# Patient Record
Sex: Female | Born: 1965
Health system: Southern US, Community
[De-identification: ages and names within clinical notes are randomized; demographics above are authoritative.]

## PROBLEM LIST (undated history)

## (undated) DIAGNOSIS — E034 Atrophy of thyroid (acquired): Secondary | ICD-10-CM

## (undated) DIAGNOSIS — E782 Mixed hyperlipidemia: Secondary | ICD-10-CM

## (undated) HISTORY — DX: Mixed hyperlipidemia: E78.2

---
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
## 2018-01-31 LAB — HM PAP SMEAR

## 2018-02-01 DIAGNOSIS — E038 Other specified hypothyroidism: Secondary | ICD-10-CM | POA: Diagnosis not present

## 2018-02-01 DIAGNOSIS — M8580 Other specified disorders of bone density and structure, unspecified site: Secondary | ICD-10-CM | POA: Diagnosis not present

## 2018-02-01 DIAGNOSIS — E782 Mixed hyperlipidemia: Secondary | ICD-10-CM | POA: Diagnosis not present

## 2018-02-13 DIAGNOSIS — E041 Nontoxic single thyroid nodule: Secondary | ICD-10-CM | POA: Diagnosis not present

## 2018-05-15 DIAGNOSIS — E034 Atrophy of thyroid (acquired): Secondary | ICD-10-CM | POA: Diagnosis not present

## 2018-08-15 DIAGNOSIS — E034 Atrophy of thyroid (acquired): Secondary | ICD-10-CM | POA: Diagnosis not present

## 2018-08-15 DIAGNOSIS — Z1211 Encounter for screening for malignant neoplasm of colon: Secondary | ICD-10-CM | POA: Diagnosis not present

## 2018-08-15 DIAGNOSIS — Z1231 Encounter for screening mammogram for malignant neoplasm of breast: Secondary | ICD-10-CM | POA: Diagnosis not present

## 2018-09-04 DIAGNOSIS — Z1211 Encounter for screening for malignant neoplasm of colon: Secondary | ICD-10-CM | POA: Diagnosis not present

## 2018-09-04 LAB — COLOGUARD: Cologuard: NEGATIVE

## 2018-09-19 DIAGNOSIS — D72818 Other decreased white blood cell count: Secondary | ICD-10-CM | POA: Diagnosis not present

## 2018-10-05 DIAGNOSIS — Z1231 Encounter for screening mammogram for malignant neoplasm of breast: Secondary | ICD-10-CM | POA: Diagnosis not present

## 2018-10-05 LAB — HM MAMMOGRAPHY: HM Mammogram: NORMAL (ref 0–4)

## 2018-11-27 DIAGNOSIS — E782 Mixed hyperlipidemia: Secondary | ICD-10-CM | POA: Diagnosis not present

## 2018-11-27 DIAGNOSIS — E034 Atrophy of thyroid (acquired): Secondary | ICD-10-CM | POA: Diagnosis not present

## 2019-01-03 DIAGNOSIS — D72818 Other decreased white blood cell count: Secondary | ICD-10-CM | POA: Diagnosis not present

## 2019-12-16 ENCOUNTER — Other Ambulatory Visit: Payer: Self-pay

## 2019-12-16 MED ORDER — LEVOTHYROXINE SODIUM 88 MCG PO TABS: 88.0000 ug | ORAL_TABLET | Freq: Every day | ORAL | 0 refills | Status: DC

## 2019-12-18 ENCOUNTER — Other Ambulatory Visit: Payer: Self-pay | Admitting: Nurse Practitioner

## 2019-12-18 ENCOUNTER — Ambulatory Visit: Payer: Self-pay | Admitting: Nurse Practitioner

## 2019-12-18 ENCOUNTER — Other Ambulatory Visit: Payer: Self-pay

## 2019-12-18 ENCOUNTER — Ambulatory Visit (INDEPENDENT_AMBULATORY_CARE_PROVIDER_SITE_OTHER): Payer: 59 | Admitting: Nurse Practitioner

## 2019-12-18 ENCOUNTER — Encounter: Payer: Self-pay | Admitting: Nurse Practitioner

## 2019-12-18 VITALS — BP 110/78 | HR 60 | Temp 97.5°F | Ht 62.0 in | Wt 146.0 lb

## 2019-12-18 DIAGNOSIS — E034 Atrophy of thyroid (acquired): Secondary | ICD-10-CM | POA: Diagnosis not present

## 2019-12-18 DIAGNOSIS — E782 Mixed hyperlipidemia: Secondary | ICD-10-CM

## 2019-12-18 DIAGNOSIS — R799 Abnormal finding of blood chemistry, unspecified: Secondary | ICD-10-CM | POA: Diagnosis not present

## 2019-12-18 NOTE — Assessment & Plan Note (Signed)
Well controlled.  ?No changes to medicines.  ?Continue to work on eating a healthy diet and exercise.  ?Labs drawn today.  ?

## 2019-12-18 NOTE — Progress Notes (Signed)
Established Patient Office Visit  Subjective:  Patient ID: Joann Jenkins, female    DOB: 1966-08-24  Age: 54 y.o. MRN: [id number]  [id number]  is a  54 year old female. She is here for 6 months follow up on Hypothyroidism, and hyperlipidemia.  The patient's medications were reviewed and reconciled since the patient's last visit.  History details were provided by the patient. The history appears to be reliable.   Chief Complaint  Patient presents with  . Hypothyroidism    follow up  . Hyperlipidemia    follow up    HPI Joann Jenkins presents with atrophy of thyroid (acquired).  Date of first diagnosis 90.  She is currently taking Synthroid, 88 mcg daily.  TSH was last checked 6 months ago.  The result was reported as normal ( 1.910 mU/L ).  She denies related symptoms such as cold intolerance, constipation, depression, dry skin, fatigue, hair loss and weight gain.  She reports no symptoms suggestive of adverse medication effect.      Pt presents with hyperlipidemia.  Date of diagnosis 08/16/2018.  Current treatment includes a low cholesterol/low fat diet.  Compliance with treatment has been good; she takes her medication as directed, maintains her low cholesterol diet, follows up as directed, and maintains her exercise regimen.  She denies experiencing any hypercholesterolemia related symptoms.  Most recent lab tests include total cholesterol level ( fasting 244 mg/dl), triglycerides ( '182mg'$ /dl ), HDL ( 60 mg/dL ), and LDL cholesterol ( 148 mg/dl ).  States she stopped the Crestor due to joint pain.  Concurrent health problems include hypothyroidism.    Past Medical History:  Diagnosis Date  . Atrophy of thyroid (acquired)   . Mixed hyperlipidemia     Past Surgical History:  Procedure Laterality Date  . CESAREAN SECTION     X2 10-20-1994 & 07-30-1997    Family History  Problem Relation Age of Onset  . Pancreatic cancer Mother   . COPD Father     Social History    Socioeconomic History  . Marital status: Married    Spouse name: Not on file  . Number of children: 2  . Years of education: Not on file  . Highest education level: Not on file  Occupational History  . Not on file  Tobacco Use  . Smoking status: Never Smoker  . Smokeless tobacco: Never Used  Substance and Sexual Activity  . Alcohol use: Yes    Comment: Drinks approximately 2-3 times per week. She typically consumes wine.  . Drug use: Never  . Sexual activity: Not on file  Other Topics Concern  . Not on file  Social History Narrative  . Not on file   Social Determinants of Health   Financial Resource Strain:   . Difficulty of Paying Living Expenses:   Food Insecurity:   . Worried About Charity fundraiser in the Last Year:   . Arboriculturist in the Last Year:   Transportation Needs:   . Film/video editor (Medical):   Marland Kitchen Lack of Transportation (Non-Medical):   Physical Activity:   . Days of Exercise per Week:   . Minutes of Exercise per Session:   Stress:   . Feeling of Stress :   Social Connections:   . Frequency of Communication with Friends and Family:   . Frequency of Social Gatherings with Friends and Family:   . Attends Religious Services:   . Active Member of Clubs or Organizations:   .  Attends Archivist Meetings:   Marland Kitchen Marital Status:   Intimate Partner Violence:   . Fear of Current or Ex-Partner:   . Emotionally Abused:   Marland Kitchen Physically Abused:   . Sexually Abused:     Outpatient Medications Prior to Visit  Medication Sig Dispense Refill  . ezetimibe (ZETIA) 10 MG tablet Take 10 mg by mouth daily.    Marland Kitchen levothyroxine (SYNTHROID) 88 MCG tablet Take 1 tablet (88 mcg total) by mouth daily before breakfast. 30 tablet 0  . zolpidem (AMBIEN) 5 MG tablet Take 5 mg by mouth at bedtime as needed for sleep.     No facility-administered medications prior to visit.    Allergies  Allergen Reactions  . Lipitor [Atorvastatin]   . Rosuvastatin  Calcium     ROS Review of Systems  Constitutional: Negative for activity change and appetite change.  HENT: Negative for dental problem, ear discharge and sinus pain.   Eyes: Negative for discharge.  Respiratory: Positive for cough and chest tightness.   Cardiovascular: Negative for chest pain and palpitations.  Gastrointestinal: Negative for abdominal distention.  Genitourinary: Negative for difficulty urinating.  Musculoskeletal: Positive for arthralgias. Negative for myalgias.  Skin: Negative for color change.  Neurological: Negative for weakness.      Objective:    Physical Exam  Constitutional: She is oriented to person, place, and time. She appears well-developed and well-nourished.  HENT:  Head: Normocephalic.  Mouth/Throat: Oropharynx is clear and moist.  Eyes: Right eye exhibits no discharge. Left eye exhibits no discharge.  Cardiovascular: Normal rate and regular rhythm.  Pulmonary/Chest: Effort normal and breath sounds normal.  Abdominal: Soft. Bowel sounds are normal.  Musculoskeletal:     Cervical back: Normal range of motion and neck supple.  Neurological: She is alert and oriented to person, place, and time.  Skin: Skin is warm. No rash noted.    BP 110/78 (BP Location: Left Arm, Patient Position: Sitting)   Pulse 60   Temp (!) 97.5 F (36.4 C) (Temporal)   Ht '5\' 2"'$  (1.575 m)   Wt 146 lb (66.2 kg)   SpO2 99%   BMI 26.70 kg/m  Wt Readings from Last 3 Encounters:  12/18/19 146 lb (66.2 kg)     Health Maintenance Due  Topic Date Due  . Hepatitis C Screening  Never done  . HIV Screening  Never done  . TETANUS/TDAP  Never done  . COLONOSCOPY  Never done  . INFLUENZA VACCINE  Never done    There are no preventive care reminders to display for this patient.  No results found for: TSH No results found for: WBC, HGB, HCT, MCV, PLT No results found for: NA, K, CHLORIDE, CO2, GLUCOSE, BUN, CREATININE, BILITOT, ALKPHOS, AST, ALT, PROT, ALBUMIN,  CALCIUM, ANIONGAP, EGFR, GFR No results found for: CHOL No results found for: HDL No results found for: LDLCALC No results found for: TRIG No results found for: CHOLHDL No results found for: HGBA1C    Assessment & Plan:  Atrophy of thyroid Well controlled.  No changes to medicines.  Labs drawn today.   Abnormal blood chemistry Labs drawn.  Mixed hyperlipidemia Well controlled.  No changes to medicines.  Continue to work on eating a healthy diet and exercise.  Labs drawn today.    Follow-up: Return in about 6 months (around 06/19/2020).    Ivy Lynn, NP

## 2019-12-18 NOTE — Assessment & Plan Note (Addendum)
Well controlled.  No changes to medicines.    Labs drawn today.  

## 2019-12-18 NOTE — Patient Instructions (Addendum)
Atrophy of thyroid Well controlled.  No changes to medicines.  Continue to work on eating a healthy diet and exercise.  Labs drawn today.   Abnormal blood chemistry Well controlled.  No changes to medicines.  Continue to work on eating a healthy diet and exercise.  Labs drawn today.   Mixed hyperlipidemia Well controlled.  No changes to medicines.  Continue to work on eating a healthy diet and exercise.  Labs drawn today.     Thyroid-Stimulating Hormone Test Why am I having this test? You may have a thyroid-stimulating hormone (TSH) test if you have possible symptoms of abnormal thyroid hormone levels. This test can help your health care provider:  Diagnose a disorder of the thyroid gland or pituitary gland.  Manage your condition and treatment if you have an underactive thyroid (hypothyroidism) or an overactive thyroid (hyperthyroidism). Newborn babies may have this test done to screen for hypothyroidism that is present at birth (congenital). The thyroid is a gland in the lower front of the neck. It makes hormones that affect many body parts and systems, including the system that affects how quickly the body burns fuel for energy (metabolism). The pituitary gland is located just below the brain, behind the eyes and nasal passages. It helps maintain thyroid hormone levels and thyroid gland function. What is being tested? This test measures the amount of TSH in your blood. TSH may also be called thyrotropin. When the thyroid does not make enough hormones, the pituitary gland releases TSH into the bloodstream to stimulate the thyroid gland to make more hormones. What kind of sample is taken?     A blood sample is required for this test. It is usually collected by inserting a needle into a blood vessel. For newborns, a small amount of blood may be collected from the umbilical cord, or by using a small needle to prick the baby's heel (heel stick). Tell a health care provider  about:  All medicines you are taking, including vitamins, herbs, eye drops, creams, and over-the-counter medicines.  Any blood disorders you have.  Any surgeries you have had.  Any medical conditions you have.  Whether you are pregnant or may be pregnant. How are the results reported? Your test results will be reported as a value that indicates how much TSH is in your blood. Your health care provider will compare your results to normal ranges that were established after testing a large group of people (reference ranges). Reference ranges may vary among labs and hospitals. For this test, common reference ranges are:  Adult: 2-10 microunits/mL or 2-10 milliunits/L.  Newborn: ? Heel stick: 3-18 microunits/mL or 3-18 milliunits/L. ? Umbilical cord: 3-12 microunits/mL or 3-12 milliunits/L. What do the results mean? Results that are within the reference range are considered normal. This means that you have a normal amount of TSH in your blood. Results that are higher than the reference range mean that your TSH levels are too high. This may mean:  Your thyroid gland is not making enough thyroid hormones.  Your thyroid medicine dosage is too low.  You have a tumor on your pituitary gland. This is rare. Results that are lower than the reference range mean that your TSH levels are too low. This may be caused by hyperthyroidism or by a problem with the pituitary gland function. Talk with your health care provider about what your results mean. Questions to ask your health care provider Ask your health care provider, or the department that is doing the test:  When will my results be ready?  How will I get my results?  What are my treatment options?  What other tests do I need?  What are my next steps? Summary  You may have a thyroid-stimulating hormone (TSH) test if you have possible symptoms of abnormal thyroid hormone levels.  The thyroid is a gland in the lower front of the neck.  It makes hormones that affect many body parts and systems.  The pituitary gland is located just below the brain, behind the eyes and nasal passages. It helps maintain thyroid hormone levels and thyroid gland function.  This test measures the amount of TSH in your blood. TSH is made by the pituitary gland. It may also be called thyrotropin. This information is not intended to replace advice given to you by your health care provider. Make sure you discuss any questions you have with your health care provider. Document Revised: 12/18/2017 Document Reviewed: 05/23/2017 Elsevier Patient Education  2020 Elsevier Inc. High Cholesterol  High cholesterol is a condition in which the blood has high levels of a white, waxy, fat-like substance (cholesterol). The human body needs small amounts of cholesterol. The liver makes all the cholesterol that the body needs. Extra (excess) cholesterol comes from the food that we eat. Cholesterol is carried from the liver by the blood through the blood vessels. If you have high cholesterol, deposits (plaques) may build up on the walls of your blood vessels (arteries). Plaques make the arteries narrower and stiffer. Cholesterol plaques increase your risk for heart attack and stroke. Work with your health care provider to keep your cholesterol levels in a healthy range. What increases the risk? This condition is more likely to develop in people who:  Eat foods that are high in animal fat (saturated fat) or cholesterol.  Are overweight.  Are not getting enough exercise.  Have a family history of high cholesterol. What are the signs or symptoms? There are no symptoms of this condition. How is this diagnosed? This condition may be diagnosed from the results of a blood test.  If you are older than age 3, your health care provider may check your cholesterol every 4-6 years.  You may be checked more often if you already have high cholesterol or other risk factors for  heart disease. The blood test for cholesterol measures:  "Bad" cholesterol (LDL cholesterol). This is the main type of cholesterol that causes heart disease. The desired level for LDL is less than 100.  "Good" cholesterol (HDL cholesterol). This type helps to protect against heart disease by cleaning the arteries and carrying the LDL away. The desired level for HDL is 60 or higher.  Triglycerides. These are fats that the body can store or burn for energy. The desired number for triglycerides is lower than 150.  Total cholesterol. This is a measure of the total amount of cholesterol in your blood, including LDL cholesterol, HDL cholesterol, and triglycerides. A healthy number is less than 200. How is this treated? This condition is treated with diet changes, lifestyle changes, and medicines. Diet changes  This may include eating more whole grains, fruits, vegetables, nuts, and fish.  This may also include cutting back on red meat and foods that have a lot of added sugar. Lifestyle changes  Changes may include getting at least 40 minutes of aerobic exercise 3 times a week. Aerobic exercises include walking, biking, and swimming. Aerobic exercise along with a healthy diet can help you maintain a healthy weight.  Changes may  also include quitting smoking. Medicines  Medicines are usually given if diet and lifestyle changes have failed to reduce your cholesterol to healthy levels.  Your health care provider may prescribe a statin medicine. Statin medicines have been shown to reduce cholesterol, which can reduce the risk of heart disease. Follow these instructions at home: Eating and drinking If told by your health care provider:  Eat chicken (without skin), fish, veal, shellfish, ground [place] breast, and round or loin cuts of red meat.  Do not eat fried foods or fatty meats, such as hot dogs and salami.  Eat plenty of fruits, such as apples.  Eat plenty of vegetables, such as  broccoli, potatoes, and carrots.  Eat beans, peas, and lentils.  Eat grains such as barley, rice, couscous, and bulgur wheat.  Eat pasta without cream sauces.  Use skim or nonfat milk, and eat low-fat or nonfat yogurt and cheeses.  Do not eat or drink whole milk, cream, ice cream, egg yolks, or hard cheeses.  Do not eat stick margarine or tub margarines that contain trans fats (also called partially hydrogenated oils).  Do not eat saturated tropical oils, such as coconut oil and palm oil.  Do not eat cakes, cookies, crackers, or other baked goods that contain trans fats.  General instructions  Exercise as directed by your health care provider. Increase your activity level with activities such as gardening, walking, and taking the stairs.  Take over-the-counter and prescription medicines only as told by your health care provider.  Do not use any products that contain nicotine or tobacco, such as cigarettes and e-cigarettes. If you need help quitting, ask your health care provider.  Keep all follow-up visits as told by your health care provider. This is important. Contact a health care provider if:  You are struggling to maintain a healthy diet or weight.  You need help to start on an exercise program.  You need help to stop smoking. Get help right away if:  You have chest pain.  You have trouble breathing. This information is not intended to replace advice given to you by your health care provider. Make sure you discuss any questions you have with your health care provider. Document Revised: 09/22/2017 Document Reviewed: 03/19/2016 Elsevier Patient Education  [place].

## 2019-12-19 LAB — LIPID PANEL W/O CHOL/HDL RATIO
Cholesterol, Total: 216 mg/dL — ABNORMAL HIGH (ref 100–199)
HDL: 66 mg/dL (ref >39)
LDL Chol Calc (NIH): 122 mg/dL — ABNORMAL HIGH (ref 0–99)
Triglycerides: 161 mg/dL — ABNORMAL HIGH (ref 0–149)
VLDL Cholesterol Cal: 28 mg/dL (ref 5–40)

## 2019-12-19 LAB — CBC WITH DIFFERENTIAL/PLATELET
Basophils Absolute: 0 10*3/uL (ref 0.0–0.2)
Basos: 1 %
EOS (ABSOLUTE): 0.1 10*3/uL (ref 0.0–0.4)
Eos: 4 %
Hematocrit: 39.6 % (ref 34.0–46.6)
Hemoglobin: 13.2 g/dL (ref 11.1–15.9)
Immature Grans (Abs): 0 10*3/uL (ref 0.0–0.1)
Immature Granulocytes: 0 %
Lymphocytes Absolute: 0.8 10*3/uL (ref 0.7–3.1)
Lymphs: 25 %
MCH: 31.2 pg (ref 26.6–33.0)
MCHC: 33.3 g/dL (ref 31.5–35.7)
MCV: 94 fL (ref 79–97)
Monocytes Absolute: 0.2 10*3/uL (ref 0.1–0.9)
Monocytes: 7 %
Neutrophils Absolute: 2 10*3/uL (ref 1.4–7.0)
Neutrophils: 63 %
Platelets: 245 10*3/uL (ref 150–450)
RBC: 4.23 x10E6/uL (ref 3.77–5.28)
RDW: 12.3 % (ref 11.7–15.4)
WBC: 3.2 10*3/uL — ABNORMAL LOW (ref 3.4–10.8)

## 2019-12-19 LAB — COMPREHENSIVE METABOLIC PANEL
ALT: 15 IU/L (ref 0–32)
AST: 20 IU/L (ref 0–40)
Albumin/Globulin Ratio: 2.2 (ref 1.2–2.2)
Albumin: 4.6 g/dL (ref 3.8–4.9)
Alkaline Phosphatase: 76 IU/L (ref 39–117)
BUN/Creatinine Ratio: 11 (ref 9–23)
BUN: 11 mg/dL (ref 6–24)
Bilirubin Total: 0.3 mg/dL (ref 0.0–1.2)
CO2: 26 mmol/L (ref 20–29)
Calcium: 9.3 mg/dL (ref 8.7–10.2)
Chloride: 104 mmol/L (ref 96–106)
Creatinine, Ser: 1.03 mg/dL — ABNORMAL HIGH (ref 0.57–1.00)
GFR calc Af Amer: 70 mL/min/{1.73_m2} (ref >59)
GFR calc non Af Amer: 61 mL/min/{1.73_m2} (ref >59)
Globulin, Total: 2.1 g/dL (ref 1.5–4.5)
Glucose: 85 mg/dL (ref 65–99)
Potassium: 5 mmol/L (ref 3.5–5.2)
Sodium: 142 mmol/L (ref 134–144)
Total Protein: 6.7 g/dL (ref 6.0–8.5)

## 2019-12-19 LAB — CARDIOVASCULAR RISK ASSESSMENT

## 2019-12-19 LAB — TSH: TSH: 2.59 u[IU]/mL (ref 0.450–4.500)

## 2019-12-25 ENCOUNTER — Other Ambulatory Visit: Payer: Self-pay | Admitting: Nurse Practitioner

## 2019-12-25 DIAGNOSIS — E782 Mixed hyperlipidemia: Secondary | ICD-10-CM

## 2019-12-25 MED ORDER — ICOSAPENT ETHYL 1 G PO CAPS: 2.0000 g | ORAL_CAPSULE | Freq: Two times a day (BID) | ORAL | 0 refills | Status: DC

## 2020-01-06 ENCOUNTER — Other Ambulatory Visit: Payer: Self-pay

## 2020-01-06 MED ORDER — LEVOTHYROXINE SODIUM 88 MCG PO TABS: 88.0000 ug | ORAL_TABLET | Freq: Every day | ORAL | 0 refills | Status: DC

## 2020-04-07 ENCOUNTER — Other Ambulatory Visit: Payer: Self-pay

## 2020-04-07 MED ORDER — EZETIMIBE 10 MG PO TABS: 10.0000 mg | ORAL_TABLET | Freq: Every day | ORAL | 2 refills | Status: DC

## 2020-04-09 ENCOUNTER — Other Ambulatory Visit: Payer: Self-pay

## 2020-04-09 MED ORDER — EZETIMIBE 10 MG PO TABS: 10.0000 mg | ORAL_TABLET | Freq: Every day | ORAL | 2 refills | Status: DC

## 2020-04-14 ENCOUNTER — Other Ambulatory Visit: Payer: Self-pay

## 2020-04-14 MED ORDER — LEVOTHYROXINE SODIUM 88 MCG PO TABS: 88.0000 ug | ORAL_TABLET | Freq: Every day | ORAL | 2 refills | Status: DC

## 2020-04-16 ENCOUNTER — Other Ambulatory Visit: Payer: Self-pay

## 2020-04-17 ENCOUNTER — Other Ambulatory Visit: Payer: Self-pay

## 2020-04-17 MED ORDER — LEVOTHYROXINE SODIUM 88 MCG PO TABS: 88.0000 ug | ORAL_TABLET | Freq: Every day | ORAL | 2 refills | Status: DC

## 2020-04-21 ENCOUNTER — Other Ambulatory Visit: Payer: Self-pay

## 2020-06-19 ENCOUNTER — Ambulatory Visit: Payer: 59 | Admitting: Nurse Practitioner

## 2020-06-23 ENCOUNTER — Encounter: Payer: Self-pay | Admitting: Legal Medicine

## 2020-06-23 ENCOUNTER — Other Ambulatory Visit: Payer: Self-pay

## 2020-06-23 ENCOUNTER — Ambulatory Visit (INDEPENDENT_AMBULATORY_CARE_PROVIDER_SITE_OTHER): Payer: 59 | Admitting: Legal Medicine

## 2020-06-23 VITALS — BP 112/80 | HR 67 | Temp 97.3°F | Ht 62.0 in | Wt 144.0 lb

## 2020-06-23 DIAGNOSIS — E039 Hypothyroidism, unspecified: Secondary | ICD-10-CM

## 2020-06-23 DIAGNOSIS — F5101 Primary insomnia: Secondary | ICD-10-CM | POA: Diagnosis not present

## 2020-06-23 DIAGNOSIS — E782 Mixed hyperlipidemia: Secondary | ICD-10-CM | POA: Diagnosis not present

## 2020-06-23 DIAGNOSIS — Z6826 Body mass index (BMI) 26.0-26.9, adult: Secondary | ICD-10-CM

## 2020-06-23 MED ORDER — EZETIMIBE 10 MG PO TABS: 10.0000 mg | ORAL_TABLET | Freq: Every day | ORAL | 2 refills | Status: DC

## 2020-06-23 MED ORDER — LEVOTHYROXINE SODIUM 88 MCG PO TABS: 88.0000 ug | ORAL_TABLET | Freq: Every day | ORAL | 2 refills | Status: DC

## 2020-06-23 MED ORDER — ZOLPIDEM TARTRATE 5 MG PO TABS: 5.0000 mg | ORAL_TABLET | Freq: Every evening | ORAL | 3 refills | Status: DC | PRN

## 2020-06-23 NOTE — Progress Notes (Signed)
Subjective:  Patient ID: Joann Jenkins, female    DOB: 1966-08-17  Age: 54 y.o. MRN: [id number]  Chief Complaint  Patient presents with  . Hyperlipidemia  . Hypothyroidism  . Insomnia    HPI: chronic visit Patient presents with hyperlipidemia.  Compliance with treatment has been good; patient takes medicines as directed, maintains low cholesterol diet, follows up as directed, and maintains exercise regimen.  Patient is using zetia without problems.  Patient has HYPOTHYROIDISM.  Diagnosed 10 years ago.  Patient has stable  thyroid readings.  Patient is having n symptoms.  Last TSH was nomeal.  continue dosage of thyroid medicine.  Insomnia chronic.  Legs pain and using ambien   No current outpatient medications on file prior to visit.   No current facility-administered medications on file prior to visit.   Past Medical History:  Diagnosis Date  . Mixed hyperlipidemia    Past Surgical History:  Procedure Laterality Date  . CESAREAN SECTION     X2 10-20-1994 & 07-30-1997    Family History  Problem Relation Age of Onset  . Pancreatic cancer Mother   . COPD Father    Social History   Socioeconomic History  . Marital status: Married    Spouse name: Not on file  . Number of children: 2  . Years of education: Not on file  . Highest education level: Not on file  Occupational History  . Not on file  Tobacco Use  . Smoking status: Never Smoker  . Smokeless tobacco: Never Used  Vaping Use  . Vaping Use: Never used  Substance and Sexual Activity  . Alcohol use: Yes    Comment: Drinks approximately 2-3 times per week. She typically consumes wine.  . Drug use: Never  . Sexual activity: Not on file  Other Topics Concern  . Not on file  Social History Narrative  . Not on file   Social Determinants of Health   Financial Resource Strain:   . Difficulty of Paying Living Expenses: Not on file  Food Insecurity:   . Worried About Programme researcher, broadcasting/film/video in the Last Year:  Not on file  . Ran Out of Food in the Last Year: Not on file  Transportation Needs:   . Lack of Transportation (Medical): Not on file  . Lack of Transportation (Non-Medical): Not on file  Physical Activity:   . Days of Exercise per Week: Not on file  . Minutes of Exercise per Session: Not on file  Stress:   . Feeling of Stress : Not on file  Social Connections:   . Frequency of Communication with Friends and Family: Not on file  . Frequency of Social Gatherings with Friends and Family: Not on file  . Attends Religious Services: Not on file  . Active Member of Clubs or Organizations: Not on file  . Attends Banker Meetings: Not on file  . Marital Status: Not on file    Review of Systems  Constitutional: Negative.   HENT: Negative.   Eyes: Positive for visual disturbance.  Respiratory: Negative for cough, shortness of breath and stridor.   Cardiovascular: Negative for chest pain, palpitations and leg swelling.  Gastrointestinal: Negative.   Genitourinary: Negative.   Musculoskeletal: Negative.   Skin: Negative.   Neurological: Negative.   Psychiatric/Behavioral: Negative.      Objective:  BP 112/80   Pulse 67   Temp (!) 97.3 F (36.3 C)   Ht 5\' 2"  (1.575 m)   Wt 144  lb (65.3 kg)   SpO2 98%   BMI 26.34 kg/m   BP/Weight 06/23/2020 12/18/2019  Systolic BP 112 110  Diastolic BP 80 78  Wt. (Lbs) 144 146  BMI 26.34 26.7    Physical Exam Vitals reviewed.  Constitutional:      Appearance: Normal appearance. She is normal weight.  HENT:     Head: Normocephalic.     Right Ear: Tympanic membrane and external ear normal.     Left Ear: Tympanic membrane, ear canal and external ear normal.     Nose: Nose normal.  Eyes:     Extraocular Movements: Extraocular movements intact.     Conjunctiva/sclera: Conjunctivae normal.     Pupils: Pupils are equal, round, and reactive to light.  Cardiovascular:     Rate and Rhythm: Normal rate and regular rhythm.      Pulses: Normal pulses.  Pulmonary:     Effort: Pulmonary effort is normal.     Breath sounds: Normal breath sounds.  Abdominal:     General: Abdomen is flat. Bowel sounds are normal.  Musculoskeletal:        General: Normal range of motion.     Cervical back: Normal range of motion and neck supple.  Skin:    General: Skin is warm and dry.     Capillary Refill: Capillary refill takes less than 2 seconds.  Neurological:     General: No focal deficit present.     Mental Status: She is alert and oriented to person, place, and time.  Psychiatric:        Mood and Affect: Mood normal.        Thought Content: Thought content normal.       Lab Results  Component Value Date   WBC 3.2 (L) 12/18/2019   HGB 13.2 12/18/2019   HCT 39.6 12/18/2019   PLT 245 12/18/2019   GLUCOSE 85 12/18/2019   CHOL 216 (H) 12/18/2019   TRIG 161 (H) 12/18/2019   HDL 66 12/18/2019   LDLCALC 122 (H) 12/18/2019   ALT 15 12/18/2019   AST 20 12/18/2019   NA 142 12/18/2019   K 5.0 12/18/2019   CL 104 12/18/2019   CREATININE 1.03 (H) 12/18/2019   BUN 11 12/18/2019   CO2 26 12/18/2019   TSH 2.590 12/18/2019      Assessment & Plan:   1. BMI 26.0-26.9,adult Continue diet and exercise  2. Primary hypothyroidism - CBC with Differential/Platelet - TSH - ezetimibe (ZETIA) 10 MG tablet; Take 1 tablet (10 mg total) by mouth daily.  Dispense: 90 tablet; Refill: 2 - levothyroxine (SYNTHROID) 88 MCG tablet; Take 1 tablet (88 mcg total) by mouth daily before breakfast.  Dispense: 90 tablet; Refill: 2 Patient is known to have hypothyroidism and is n treatment with levothyroxine .  Patient was diagnosed 10 years ago.  Other treatment includes none.  Patient is compliant with medicines and last TSH 6 months ago.  Last TSH was normal.  3. Mixed hyperlipidemia - CBC with Differential/Platelet - Comprehensive metabolic panel - Lipid panel AN INDIVIDUAL CARE PLAN for hyperlipidemia/ cholesterol was  established and reinforced today.  The patient's status was assessed using clinical findings on exam, lab and other diagnostic tests. The patient's disease status was assessed based on evidence-based guidelines and found to be well controlled. MEDICATIONS were reviewed. SELF MANAGEMENT GOALS have been discussed and patient's success at attaining the goal of low cholesterol was assessed. RECOMMENDATION given include regular exercise 3 days a week and  low cholesterol/low fat diet. CLINICAL SUMMARY including written plan to identify barriers unique to the patient due to social or economic  reasons was discussed.  4. Primary insomnia - zolpidem (AMBIEN) 5 MG tablet; Take 1 tablet (5 mg total) by mouth at bedtime as needed for sleep.  Dispense: 30 tablet; Refill: 3 AN INDIVIDUAL CARE PLAN was established and reinforced today.  The patient's status was assessed using clinical findings on exam, labs, and other diagnostic testing. Patient's success at meeting treatment goals based on disease specific evidence-bassed guidelines and found to be in good control. RECOMMENDATIONS include maintaining present medicines and treatment.   Meds ordered this encounter  Medications  . ezetimibe (ZETIA) 10 MG tablet    Sig: Take 1 tablet (10 mg total) by mouth daily.    Dispense:  90 tablet    Refill:  2  . levothyroxine (SYNTHROID) 88 MCG tablet    Sig: Take 1 tablet (88 mcg total) by mouth daily before breakfast.    Dispense:  90 tablet    Refill:  2  . zolpidem (AMBIEN) 5 MG tablet    Sig: Take 1 tablet (5 mg total) by mouth at bedtime as needed for sleep.    Dispense:  30 tablet    Refill:  3    Orders Placed This Encounter  Procedures  . CBC with Differential/Platelet  . Comprehensive metabolic panel  . Lipid panel  . TSH     Follow-up: Return in about 6 months (around 12/21/2020) for fasting.  An After Visit Summary was printed and given to the patient.  [facility] Bulla [facility] [phone]

## 2020-06-24 LAB — CBC WITH DIFFERENTIAL/PLATELET
Basophils Absolute: 0 10*3/uL (ref 0.0–0.2)
Basos: 0 %
EOS (ABSOLUTE): 0.2 10*3/uL (ref 0.0–0.4)
Eos: 2 %
Hematocrit: 37.3 % (ref 34.0–46.6)
Hemoglobin: 12.5 g/dL (ref 11.1–15.9)
Immature Grans (Abs): 0 10*3/uL (ref 0.0–0.1)
Immature Granulocytes: 0 %
Lymphocytes Absolute: 0.8 10*3/uL (ref 0.7–3.1)
Lymphs: 12 %
MCH: 32.2 pg (ref 26.6–33.0)
MCHC: 33.5 g/dL (ref 31.5–35.7)
MCV: 96 fL (ref 79–97)
Monocytes Absolute: 0.4 10*3/uL (ref 0.1–0.9)
Monocytes: 6 %
Neutrophils Absolute: 5.4 10*3/uL (ref 1.4–7.0)
Neutrophils: 80 %
Platelets: 264 10*3/uL (ref 150–450)
RBC: 3.88 x10E6/uL (ref 3.77–5.28)
RDW: 12.4 % (ref 11.7–15.4)
WBC: 6.9 10*3/uL (ref 3.4–10.8)

## 2020-06-24 LAB — COMPREHENSIVE METABOLIC PANEL
ALT: 13 IU/L (ref 0–32)
AST: 14 IU/L (ref 0–40)
Albumin/Globulin Ratio: 1.8 (ref 1.2–2.2)
Albumin: 4.6 g/dL (ref 3.8–4.9)
Alkaline Phosphatase: 87 IU/L (ref 44–121)
BUN/Creatinine Ratio: 9 (ref 9–23)
BUN: 9 mg/dL (ref 6–24)
Bilirubin Total: 0.3 mg/dL (ref 0.0–1.2)
CO2: 25 mmol/L (ref 20–29)
Calcium: 9.5 mg/dL (ref 8.7–10.2)
Chloride: 102 mmol/L (ref 96–106)
Creatinine, Ser: 1.01 mg/dL — ABNORMAL HIGH (ref 0.57–1.00)
GFR calc Af Amer: 72 mL/min/{1.73_m2} (ref >59)
GFR calc non Af Amer: 62 mL/min/{1.73_m2} (ref >59)
Globulin, Total: 2.5 g/dL (ref 1.5–4.5)
Glucose: 82 mg/dL (ref 65–99)
Potassium: 4.8 mmol/L (ref 3.5–5.2)
Sodium: 143 mmol/L (ref 134–144)
Total Protein: 7.1 g/dL (ref 6.0–8.5)

## 2020-06-24 LAB — LIPID PANEL
Chol/HDL Ratio: 3.8 ratio (ref 0.0–4.4)
Cholesterol, Total: 216 mg/dL — ABNORMAL HIGH (ref 100–199)
HDL: 57 mg/dL (ref >39)
LDL Chol Calc (NIH): 134 mg/dL — ABNORMAL HIGH (ref 0–99)
Triglycerides: 140 mg/dL (ref 0–149)
VLDL Cholesterol Cal: 25 mg/dL (ref 5–40)

## 2020-06-24 LAB — CARDIOVASCULAR RISK ASSESSMENT

## 2020-06-24 LAB — TSH: TSH: 1.4 u[IU]/mL (ref 0.450–4.500)

## 2020-06-24 NOTE — Progress Notes (Signed)
CBC normal, creatinine high, eGFR still 62 OK, kidney tests ok, LDL-c high 134 zetia not doing well.  Stay on DASH diet, TSH 1.4 normal lp

## 2020-12-23 ENCOUNTER — Ambulatory Visit: Payer: 59 | Admitting: Legal Medicine

## 2021-02-03 ENCOUNTER — Observation Stay (HOSPITAL_COMMUNITY)
Admission: EM | Admit: 2021-02-03 | Discharge: 2021-02-04 | Disposition: A | Discharge status: Home / Self Care | Payer: No Typology Code available for payment source | Attending: Surgery | Admitting: Surgery

## 2021-02-03 ENCOUNTER — Other Ambulatory Visit: Payer: Self-pay

## 2021-02-03 ENCOUNTER — Emergency Department (HOSPITAL_COMMUNITY): Payer: No Typology Code available for payment source

## 2021-02-03 ENCOUNTER — Emergency Department (HOSPITAL_COMMUNITY): Payer: No Typology Code available for payment source | Admitting: Anesthesiology

## 2021-02-03 ENCOUNTER — Encounter (HOSPITAL_COMMUNITY)
Admission: EM | Disposition: A | Discharge status: Home / Self Care | Payer: Self-pay | Source: Home / Self Care | Attending: Emergency Medicine

## 2021-02-03 ENCOUNTER — Encounter (HOSPITAL_COMMUNITY): Payer: Self-pay

## 2021-02-03 DIAGNOSIS — Z9049 Acquired absence of other specified parts of digestive tract: Secondary | ICD-10-CM

## 2021-02-03 DIAGNOSIS — Z20822 Contact with and (suspected) exposure to covid-19: Secondary | ICD-10-CM | POA: Diagnosis not present

## 2021-02-03 DIAGNOSIS — R1031 Right lower quadrant pain: Secondary | ICD-10-CM | POA: Diagnosis present

## 2021-02-03 DIAGNOSIS — E039 Hypothyroidism, unspecified: Secondary | ICD-10-CM | POA: Diagnosis not present

## 2021-02-03 DIAGNOSIS — Z79899 Other long term (current) drug therapy: Secondary | ICD-10-CM | POA: Diagnosis not present

## 2021-02-03 DIAGNOSIS — K3531 Acute appendicitis with localized peritonitis and gangrene, without perforation: Principal | ICD-10-CM | POA: Insufficient documentation

## 2021-02-03 DIAGNOSIS — K353 Acute appendicitis with localized peritonitis, without perforation or gangrene: Secondary | ICD-10-CM

## 2021-02-03 HISTORY — PX: LAPAROSCOPIC APPENDECTOMY: SHX408

## 2021-02-03 LAB — CBC WITH DIFFERENTIAL/PLATELET
Abs Immature Granulocytes: 0.04 10*3/uL (ref 0.00–0.07)
Basophils Absolute: 0 10*3/uL (ref 0.0–0.1)
Basophils Relative: 0 %
Eosinophils Absolute: 0 10*3/uL (ref 0.0–0.5)
Eosinophils Relative: 0 %
HCT: 37.4 % (ref 36.0–46.0)
Hemoglobin: 12.8 g/dL (ref 12.0–15.0)
Immature Granulocytes: 0 %
Lymphocytes Relative: 4 %
Lymphs Abs: 0.4 10*3/uL — ABNORMAL LOW (ref 0.7–4.0)
MCH: 30.1 pg (ref 26.0–34.0)
MCHC: 34.2 g/dL (ref 30.0–36.0)
MCV: 88 fL (ref 80.0–100.0)
Monocytes Absolute: 0.8 10*3/uL (ref 0.1–1.0)
Monocytes Relative: 7 %
Neutro Abs: 10.1 10*3/uL — ABNORMAL HIGH (ref 1.7–7.7)
Neutrophils Relative %: 89 %
Platelets: 258 10*3/uL (ref 150–400)
RBC: 4.25 MIL/uL (ref 3.87–5.11)
RDW: 12.4 % (ref 11.5–15.5)
WBC: 11.3 10*3/uL — ABNORMAL HIGH (ref 4.0–10.5)
nRBC: 0 % (ref 0.0–0.2)

## 2021-02-03 LAB — COMPREHENSIVE METABOLIC PANEL
ALT: 19 U/L (ref 0–44)
AST: 22 U/L (ref 15–41)
Albumin: 4.4 g/dL (ref 3.5–5.0)
Alkaline Phosphatase: 75 U/L (ref 38–126)
Anion gap: 10 (ref 5–15)
BUN: 11 mg/dL (ref 6–20)
CO2: 26 mmol/L (ref 22–32)
Calcium: 9.4 mg/dL (ref 8.9–10.3)
Chloride: 96 mmol/L — ABNORMAL LOW (ref 98–111)
Creatinine, Ser: 0.69 mg/dL (ref 0.44–1.00)
GFR, Estimated: >60 mL/min (ref >60)
Glucose, Bld: 127 mg/dL — ABNORMAL HIGH (ref 70–99)
Potassium: 4.2 mmol/L (ref 3.5–5.1)
Sodium: 132 mmol/L — ABNORMAL LOW (ref 135–145)
Total Bilirubin: 0.9 mg/dL (ref 0.3–1.2)
Total Protein: 7.4 g/dL (ref 6.5–8.1)

## 2021-02-03 LAB — URINALYSIS, ROUTINE W REFLEX MICROSCOPIC
Bilirubin Urine: NEGATIVE
Glucose, UA: NEGATIVE mg/dL
Ketones, ur: 20 mg/dL — AB
Nitrite: NEGATIVE
Protein, ur: NEGATIVE mg/dL
Specific Gravity, Urine: 1.008 (ref 1.005–1.030)
pH: 7 (ref 5.0–8.0)

## 2021-02-03 LAB — RESP PANEL BY RT-PCR (FLU A&B, COVID) ARPGX2
Influenza A by PCR: NEGATIVE
Influenza B by PCR: NEGATIVE
SARS Coronavirus 2 by RT PCR: NEGATIVE

## 2021-02-03 LAB — LIPASE, BLOOD: Lipase: 33 U/L (ref 11–51)

## 2021-02-03 SURGERY — APPENDECTOMY, LAPAROSCOPIC
Anesthesia: General

## 2021-02-03 MED ORDER — MIDAZOLAM HCL 2 MG/2ML IJ SOLN
INTRAMUSCULAR | Status: AC
  Filled 2021-02-03: qty 2

## 2021-02-03 MED ORDER — FENTANYL CITRATE (PF) 100 MCG/2ML IJ SOLN
INTRAMUSCULAR | Status: AC
  Filled 2021-02-03: qty 2

## 2021-02-03 MED ORDER — ACETAMINOPHEN 500 MG PO TABS
1000.0000 mg | ORAL_TABLET | Freq: Four times a day (QID) | ORAL | Status: DC
  Administered 2021-02-04 (×2): 1000 mg via ORAL
  Filled 2021-02-03 (×2): qty 2

## 2021-02-03 MED ORDER — ONDANSETRON HCL 4 MG/2ML IJ SOLN
INTRAMUSCULAR | Status: AC
  Filled 2021-02-03: qty 2

## 2021-02-03 MED ORDER — ONDANSETRON HCL 4 MG/2ML IJ SOLN
4.0000 mg | Freq: Four times a day (QID) | INTRAMUSCULAR | Status: DC | PRN
Start: 2021-02-03 — End: 2021-02-04

## 2021-02-03 MED ORDER — PHENYLEPHRINE 40 MCG/ML (10ML) SYRINGE FOR IV PUSH (FOR BLOOD PRESSURE SUPPORT)
PREFILLED_SYRINGE | INTRAVENOUS | Status: DC | PRN
  Administered 2021-02-03: 80 ug via INTRAVENOUS
  Administered 2021-02-03: 120 ug via INTRAVENOUS
  Administered 2021-02-03: 40 ug via INTRAVENOUS
  Administered 2021-02-03: 120 ug via INTRAVENOUS
  Administered 2021-02-03 (×2): 80 ug via INTRAVENOUS

## 2021-02-03 MED ORDER — APREPITANT 40 MG PO CAPS
40.0000 mg | ORAL_CAPSULE | Freq: Once | ORAL | Status: AC
  Administered 2021-02-03: 40 mg via ORAL

## 2021-02-03 MED ORDER — LACTATED RINGERS IV SOLN: INTRAVENOUS | Status: DC

## 2021-02-03 MED ORDER — EZETIMIBE 10 MG PO TABS
10.0000 mg | ORAL_TABLET | Freq: Every day | ORAL | Status: DC
  Administered 2021-02-04: 10 mg via ORAL
  Filled 2021-02-03: qty 1

## 2021-02-03 MED ORDER — METRONIDAZOLE 500 MG/100ML IV SOLN
500.0000 mg | Freq: Once | INTRAVENOUS | Status: AC
  Administered 2021-02-03: 500 mg via INTRAVENOUS
  Filled 2021-02-03: qty 100

## 2021-02-03 MED ORDER — SCOPOLAMINE 1 MG/3DAYS TD PT72
1.0000 | MEDICATED_PATCH | TRANSDERMAL | Status: DC
  Administered 2021-02-03: 1.5 mg via TRANSDERMAL

## 2021-02-03 MED ORDER — PROPOFOL 10 MG/ML IV BOLUS
INTRAVENOUS | Status: DC | PRN
  Administered 2021-02-03: 130 mg via INTRAVENOUS

## 2021-02-03 MED ORDER — ONDANSETRON 4 MG PO TBDP: 4.0000 mg | ORAL_TABLET | Freq: Four times a day (QID) | ORAL | Status: DC | PRN

## 2021-02-03 MED ORDER — SODIUM CHLORIDE 0.9 % IV SOLN
2.0000 g | Freq: Once | INTRAVENOUS | Status: AC
  Administered 2021-02-03: 2 g via INTRAVENOUS
  Filled 2021-02-03: qty 20

## 2021-02-03 MED ORDER — CHLORHEXIDINE GLUCONATE 0.12 % MT SOLN
15.0000 mL | OROMUCOSAL | Status: AC
  Administered 2021-02-03: 15 mL via OROMUCOSAL

## 2021-02-03 MED ORDER — SIMETHICONE 80 MG PO CHEW: 40.0000 mg | CHEWABLE_TABLET | Freq: Four times a day (QID) | ORAL | Status: DC | PRN

## 2021-02-03 MED ORDER — PROPOFOL 10 MG/ML IV BOLUS
INTRAVENOUS | Status: AC
  Filled 2021-02-03: qty 20

## 2021-02-03 MED ORDER — ACETAMINOPHEN 500 MG PO TABS
ORAL_TABLET | ORAL | Status: AC
  Filled 2021-02-03: qty 2

## 2021-02-03 MED ORDER — ONDANSETRON HCL 4 MG/2ML IJ SOLN
INTRAMUSCULAR | Status: DC | PRN
  Administered 2021-02-03: 4 mg via INTRAVENOUS

## 2021-02-03 MED ORDER — LACTATED RINGERS IV SOLN
INTRAVENOUS | Status: DC | PRN
  Administered 2021-02-03: 1000 mL

## 2021-02-03 MED ORDER — DIPHENHYDRAMINE HCL 12.5 MG/5ML PO ELIX
12.5000 mg | ORAL_SOLUTION | Freq: Four times a day (QID) | ORAL | Status: DC | PRN
  Filled 2021-02-03: qty 5

## 2021-02-03 MED ORDER — BUPIVACAINE HCL (PF) 0.25 % IJ SOLN
INTRAMUSCULAR | Status: AC
  Filled 2021-02-03: qty 30

## 2021-02-03 MED ORDER — FENTANYL CITRATE (PF) 100 MCG/2ML IJ SOLN: 25.0000 ug | INTRAMUSCULAR | Status: DC | PRN

## 2021-02-03 MED ORDER — DEXAMETHASONE SODIUM PHOSPHATE 10 MG/ML IJ SOLN
INTRAMUSCULAR | Status: DC | PRN
  Administered 2021-02-03: 5 mg via INTRAVENOUS

## 2021-02-03 MED ORDER — ROCURONIUM BROMIDE 10 MG/ML (PF) SYRINGE
PREFILLED_SYRINGE | INTRAVENOUS | Status: DC | PRN
  Administered 2021-02-03: 40 mg via INTRAVENOUS
  Administered 2021-02-03: 10 mg via INTRAVENOUS

## 2021-02-03 MED ORDER — DOCUSATE SODIUM 100 MG PO CAPS
100.0000 mg | ORAL_CAPSULE | Freq: Two times a day (BID) | ORAL | Status: DC
  Administered 2021-02-03 – 2021-02-04 (×2): 100 mg via ORAL
  Filled 2021-02-03 (×2): qty 1

## 2021-02-03 MED ORDER — DIPHENHYDRAMINE HCL 50 MG/ML IJ SOLN: 12.5000 mg | Freq: Four times a day (QID) | INTRAMUSCULAR | Status: DC | PRN

## 2021-02-03 MED ORDER — IBUPROFEN 400 MG PO TABS: 600.0000 mg | ORAL_TABLET | Freq: Four times a day (QID) | ORAL | Status: DC | PRN

## 2021-02-03 MED ORDER — HEPARIN SODIUM (PORCINE) 5000 UNIT/ML IJ SOLN
5000.0000 [IU] | Freq: Three times a day (TID) | INTRAMUSCULAR | Status: DC
  Administered 2021-02-04: 5000 [IU] via SUBCUTANEOUS
  Filled 2021-02-03: qty 1

## 2021-02-03 MED ORDER — ACETAMINOPHEN 500 MG PO TABS
1000.0000 mg | ORAL_TABLET | Freq: Once | ORAL | Status: AC
Start: 2021-02-03 — End: 2021-02-03
  Administered 2021-02-03: 1000 mg via ORAL

## 2021-02-03 MED ORDER — SCOPOLAMINE 1 MG/3DAYS TD PT72
MEDICATED_PATCH | TRANSDERMAL | Status: AC
  Filled 2021-02-03: qty 1

## 2021-02-03 MED ORDER — APREPITANT 40 MG PO CAPS
ORAL_CAPSULE | ORAL | Status: AC
  Filled 2021-02-03: qty 1

## 2021-02-03 MED ORDER — SUGAMMADEX SODIUM 200 MG/2ML IV SOLN
INTRAVENOUS | Status: DC | PRN
  Administered 2021-02-03: 200 mg via INTRAVENOUS

## 2021-02-03 MED ORDER — TRAMADOL HCL 50 MG PO TABS: 50.0000 mg | ORAL_TABLET | Freq: Four times a day (QID) | ORAL | Status: DC | PRN

## 2021-02-03 MED ORDER — BUPIVACAINE-EPINEPHRINE (PF) 0.25% -1:200000 IJ SOLN
INTRAMUSCULAR | Status: DC | PRN
  Administered 2021-02-03: 20 mL

## 2021-02-03 MED ORDER — SUCCINYLCHOLINE CHLORIDE 20 MG/ML IJ SOLN
INTRAMUSCULAR | Status: DC | PRN
  Administered 2021-02-03: 140 mg via INTRAVENOUS

## 2021-02-03 MED ORDER — MIDAZOLAM HCL 5 MG/5ML IJ SOLN
INTRAMUSCULAR | Status: DC | PRN
  Administered 2021-02-03: 2 mg via INTRAVENOUS

## 2021-02-03 MED ORDER — METOCLOPRAMIDE HCL 5 MG/ML IJ SOLN
10.0000 mg | Freq: Once | INTRAMUSCULAR | Status: AC
  Administered 2021-02-03: 10 mg via INTRAVENOUS
  Filled 2021-02-03: qty 2

## 2021-02-03 MED ORDER — MORPHINE SULFATE (PF) 4 MG/ML IV SOLN
4.0000 mg | Freq: Once | INTRAVENOUS | Status: AC
Start: 2021-02-03 — End: 2021-02-03
  Administered 2021-02-03: 4 mg via INTRAVENOUS
  Filled 2021-02-03: qty 1

## 2021-02-03 MED ORDER — LIDOCAINE 2% (20 MG/ML) 5 ML SYRINGE
INTRAMUSCULAR | Status: DC | PRN
  Administered 2021-02-03: 60 mg via INTRAVENOUS

## 2021-02-03 MED ORDER — IOHEXOL 300 MG/ML  SOLN
100.0000 mL | Freq: Once | INTRAMUSCULAR | Status: AC | PRN
  Administered 2021-02-03: 100 mL via INTRAVENOUS

## 2021-02-03 MED ORDER — SODIUM CHLORIDE 0.9 % IV BOLUS
1000.0000 mL | Freq: Once | INTRAVENOUS | Status: AC
  Administered 2021-02-03: 1000 mL via INTRAVENOUS

## 2021-02-03 MED ORDER — ONDANSETRON HCL 4 MG/2ML IJ SOLN
4.0000 mg | Freq: Once | INTRAMUSCULAR | Status: AC
  Administered 2021-02-03: 4 mg via INTRAVENOUS
  Filled 2021-02-03: qty 2

## 2021-02-03 MED ORDER — FENTANYL CITRATE (PF) 250 MCG/5ML IJ SOLN
INTRAMUSCULAR | Status: AC
  Filled 2021-02-03: qty 5

## 2021-02-03 MED ORDER — HYDRALAZINE HCL 20 MG/ML IJ SOLN: 10.0000 mg | INTRAMUSCULAR | Status: DC | PRN

## 2021-02-03 MED ORDER — FENTANYL CITRATE (PF) 100 MCG/2ML IJ SOLN
INTRAMUSCULAR | Status: DC | PRN
  Administered 2021-02-03: 50 ug via INTRAVENOUS
  Administered 2021-02-03: 100 ug via INTRAVENOUS

## 2021-02-03 MED ORDER — BUPIVACAINE-EPINEPHRINE 0.25% -1:200000 IJ SOLN
INTRAMUSCULAR | Status: AC
  Filled 2021-02-03: qty 1

## 2021-02-03 MED ORDER — HYDROMORPHONE HCL 1 MG/ML IJ SOLN: 0.5000 mg | INTRAMUSCULAR | Status: DC | PRN

## 2021-02-03 MED ORDER — LEVOTHYROXINE SODIUM 88 MCG PO TABS
88.0000 ug | ORAL_TABLET | Freq: Every day | ORAL | Status: DC
  Administered 2021-02-04: 88 ug via ORAL
  Filled 2021-02-03: qty 1

## 2021-02-03 SURGICAL SUPPLY — 41 items
APPLIER CLIP 5 13 M/L LIGAMAX5 (MISCELLANEOUS)
APPLIER CLIP ROT 10 11.4 M/L (STAPLE)
CABLE HIGH FREQUENCY MONO STRZ (ELECTRODE) IMPLANT
CHLORAPREP W/TINT 26 (MISCELLANEOUS) ×2 IMPLANT
CLIP APPLIE 5 13 M/L LIGAMAX5 (MISCELLANEOUS) IMPLANT
CLIP APPLIE ROT 10 11.4 M/L (STAPLE) IMPLANT
COVER SURGICAL LIGHT HANDLE (MISCELLANEOUS) ×2 IMPLANT
COVER WAND RF STERILE (DRAPES) ×2 IMPLANT
CUTTER FLEX LINEAR 45M (STAPLE) ×2 IMPLANT
DECANTER SPIKE VIAL GLASS SM (MISCELLANEOUS) ×2 IMPLANT
DERMABOND ADVANCED (GAUZE/BANDAGES/DRESSINGS) ×1
DERMABOND ADVANCED .7 DNX12 (GAUZE/BANDAGES/DRESSINGS) ×1 IMPLANT
DRAIN CHANNEL 19F RND (DRAIN) IMPLANT
ELECT PENCIL ROCKER SW 15FT (MISCELLANEOUS) ×2 IMPLANT
ELECT REM PT RETURN 15FT ADLT (MISCELLANEOUS) ×2 IMPLANT
ENDOLOOP SUT PDS II  0 18 (SUTURE)
ENDOLOOP SUT PDS II 0 18 (SUTURE) IMPLANT
EVACUATOR SILICONE 100CC (DRAIN) IMPLANT
GLOVE SURG ENC MOIS LTX SZ7.5 (GLOVE) ×2 IMPLANT
GLOVE SURG UNDER LTX SZ8 (GLOVE) ×2 IMPLANT
GOWN STRL REUS W/TWL XL LVL3 (GOWN DISPOSABLE) ×4 IMPLANT
IRRIG SUCT STRYKERFLOW 2 WTIP (MISCELLANEOUS) ×2
IRRIGATION SUCT STRKRFLW 2 WTP (MISCELLANEOUS) ×1 IMPLANT
KIT BASIN OR (CUSTOM PROCEDURE TRAY) ×2 IMPLANT
KIT TURNOVER KIT A (KITS) ×2 IMPLANT
POUCH SPECIMEN RETRIEVAL 10MM (ENDOMECHANICALS) ×2 IMPLANT
RELOAD 45 VASCULAR/THIN (ENDOMECHANICALS) IMPLANT
RELOAD STAPLE TA45 3.5 REG BLU (ENDOMECHANICALS) ×4 IMPLANT
SCISSORS LAP 5X35 DISP (ENDOMECHANICALS) ×2 IMPLANT
SET TUBE SMOKE EVAC HIGH FLOW (TUBING) ×2 IMPLANT
SHEARS HARMONIC ACE PLUS 36CM (ENDOMECHANICALS) ×2 IMPLANT
SLEEVE ADV FIXATION 5X100MM (TROCAR) ×2 IMPLANT
SUT ETHILON 3 0 PS 1 (SUTURE) IMPLANT
SUT MNCRL AB 4-0 PS2 18 (SUTURE) ×2 IMPLANT
TOWEL OR 17X26 10 PK STRL BLUE (TOWEL DISPOSABLE) IMPLANT
TOWEL OR NON WOVEN STRL DISP B (DISPOSABLE) ×2 IMPLANT
TRAY FOLEY MTR SLVR 14FR STAT (SET/KITS/TRAYS/PACK) IMPLANT
TRAY FOLEY MTR SLVR 16FR STAT (SET/KITS/TRAYS/PACK) IMPLANT
TRAY LAPAROSCOPIC (CUSTOM PROCEDURE TRAY) ×2 IMPLANT
TROCAR ADV FIXATION 5X100MM (TROCAR) ×2 IMPLANT
TROCAR XCEL BLUNT TIP 100MML (ENDOMECHANICALS) ×2 IMPLANT

## 2021-02-03 NOTE — Op Note (Addendum)
Joann Jenkins [id number]   PRE-OPERATIVE DIAGNOSIS:  Acute appendicitis without evident perforation  POST-OPERATIVE DIAGNOSIS:  Acute gangrenous appendicitis  Procedure(s): APPENDECTOMY LAPAROSCOPIC  PROCEDURE: Laparoscopic appendectomy  SURGEON:  Stephanie Coup. Cliffton Asters, M.D.  ANESTHESIA: General endotracheal  EBL:   10 mL  DRAINS: None  SPECIMEN:  Appendix  COUNTS:  Sponge, needle and instrument counts were reported correct x2 at conclusion of the operation  DISPOSITION:  PACU in satisfactory condition  COMPLICATIONS: None  FINDINGS: Acute suppurative appendicitis with gangrenous changes. Uterus adherent to lower midline abdomen at her prior cesarean site.  DESCRIPTION:   The patient was identified & brought into the operating room. SCDs were in place and functioning. General endotracheal anesthesia was administered. Preoperative antibiotics were administered. The patient was positioned supine with left arm tucked. Hair on the abdomen was then clipped. A foley catheter was inserted under sterile conditions. The abdomen was prepped and draped in the standard sterile fashion. A surgical timeout confirmed our plan.  A small incision was made in the infraumbilical location. The subcutaneous tissue was dissected and the umbilical stalk identified. The stalk was grasped with a Kocher and retracted outwardly. The infraumbilical fascia was exposed and incised. Peritoneal entry was carefully made bluntly. A 0 Vicryl purse-string suture was placed and then the [facility] port was introduced into the abdomen.  CO2 insufflation commenced to . The laparoscope was inserted and confirmed no evidence of trocar site complications. The patient was then positioned in Trendelenburg. Two additional ports were placed in left lower quadrant under direct visualization. The bed was then slightly tilted to place the left side down.  The terminal ileum was identified and normal. The appendix was  identified and attachments to the appendix to the surrounding tissues were freed without difficulty. There is ~5 cc of purulent fluid in the pelvis and another 2-3 cc in the RLQ. The appendix has gangrenous changes.  The appendix was elevated.  The base of the appendix was circumferentially dissected taking care to preserve the cecum free of injury. The base was noted to be viable and healthy appearing. The terminal ileum, cecum and ascending colon also appeared normal. The base of the appendix was then stapled with a blue load, taking a small healthy cuff of viable cecum, taking care to stay clear of the ileocecal valve. The mesoappendix was then ligated staying close to the appendix using the harmonic scalpel. The mesoappendix was inspected and noted to be hemostatic. The appendix was placed in an EndoBag.  The right lower quadrant was conservatively irrigated. Hemostasis was noted to be achieved - taking time to inspect the ligated mesoappendix, colon mesentery, and retroperitoneum. Staple line was noted to be intact on the cecum with no bleeding. There was no perforation or injury. The right lower quadrant appeared clean and as such, no drain was placed.  The left sided ports were removed under direct visualization. The EndoBag was then removed through the umbilical port site and passed off as specimen. The CO2 was exhausted from the abdomen. The umbilical fascia was then closed by closing the 0 Vicryl suture. The fascia was palpated and the closure did not feel completely closed. Therefore this was removed and a fresh 0 Vicryl suture was used to close the fascia. This was done in a figure of eight fashion. The fascia was then palpated and noted to be completely closed. The skin of all port sites was then approximated using 4-0 Monocryl suture. The incisions were covered with Dermabond.  She was  then awakened from general anesthesia, extubated, and transferred to a stretcher for transport to recover in  satisfactory condition.

## 2021-02-03 NOTE — ED Triage Notes (Signed)
Pt arrived via walk in, c/o vomiting and RLQ pain since last night. Also endorses fevers, chills, diarrhea. Denies any urinary sx.

## 2021-02-03 NOTE — Anesthesia Procedure Notes (Signed)
Procedure Name: Intubation Performed by: Darriona Dehaas J, CRNA Pre-anesthesia Checklist: Patient identified, Emergency Drugs available, Suction available, Patient being monitored and Timeout performed Patient Re-evaluated:Patient Re-evaluated prior to induction Oxygen Delivery Method: Circle system utilized Preoxygenation: Pre-oxygenation with 100% oxygen Induction Type: IV induction and Rapid sequence Laryngoscope Size: Mac and 3 Grade View: Grade I Tube type: Oral Tube size: 7.0 mm Number of attempts: 1 Airway Equipment and Method: Stylet Placement Confirmation: ETT inserted through vocal cords under direct vision,  positive ETCO2 and breath sounds checked- equal and bilateral Secured at: 21 cm Tube secured with: Tape Dental Injury: Teeth and Oropharynx as per pre-operative assessment        

## 2021-02-03 NOTE — Anesthesia Postprocedure Evaluation (Signed)
Anesthesia Post Note  Patient: Joann Jenkins  Procedure(s) Performed: APPENDECTOMY LAPAROSCOPIC (N/A )     Patient location during evaluation: PACU Anesthesia Type: General Level of consciousness: awake and alert Pain management: pain level controlled Vital Signs Assessment: post-procedure vital signs reviewed and stable Respiratory status: spontaneous breathing, nonlabored ventilation, respiratory function stable and patient connected to nasal cannula oxygen Cardiovascular status: blood pressure returned to baseline and stable Postop Assessment: no apparent nausea or vomiting Anesthetic complications: no   No complications documented.  Last Vitals:  Vitals:   02/03/21 2047 02/03/21 2058  BP:  109/67  Pulse: 71 70  Resp: 20 15  Temp:  36.8 C  SpO2: 93% 98%    Last Pain:  Vitals:   02/03/21 1840  TempSrc: Oral  PainSc: 4                  Makennah Omura L Badr Piedra

## 2021-02-03 NOTE — Anesthesia Preprocedure Evaluation (Addendum)
Anesthesia Evaluation  Patient identified by MRN, date of birth, ID band Patient awake    Reviewed: Allergy & Precautions, NPO status , Patient's Chart, lab work & pertinent test results  Airway Mallampati: II  TM Distance: >3 FB Neck ROM: Full    Dental no notable dental hx. (+) Teeth Intact, Dental Advisory Given   Pulmonary neg pulmonary ROS,    Pulmonary exam normal breath sounds clear to auscultation       Cardiovascular Normal cardiovascular exam Rhythm:Regular Rate:Normal  HLD   Neuro/Psych negative neurological ROS  negative psych ROS   GI/Hepatic negative GI ROS, Neg liver ROS,   Endo/Other  Hypothyroidism   Renal/GU negative Renal ROS  negative genitourinary   Musculoskeletal negative musculoskeletal ROS (+)   Abdominal   Peds  Hematology negative hematology ROS (+)   Anesthesia Other Findings   Reproductive/Obstetrics                            Anesthesia Physical Anesthesia Plan  ASA: II and emergent  Anesthesia Plan: General   Post-op Pain Management:    Induction: Intravenous and Rapid sequence  PONV Risk Score and Plan: 3 and Midazolam, Dexamethasone, Ondansetron and Scopolamine patch - Pre-op  Airway Management Planned: Oral ETT  Additional Equipment:   Intra-op Plan:   Post-operative Plan: Extubation in OR  Informed Consent: I have reviewed the patients History and Physical, chart, labs and discussed the procedure including the risks, benefits and alternatives for the proposed anesthesia with the patient or authorized representative who has indicated his/her understanding and acceptance.     Dental advisory given  Plan Discussed with: CRNA  Anesthesia Plan Comments:        Anesthesia Quick Evaluation

## 2021-02-03 NOTE — H&P (Addendum)
CC: Right sided abdominal pain x1 day; CT shows acute appendicitis  HPI: Joann Jenkins is an 55 y.o. female with hx of HLD presented to the emergency department today with a 1 day history of right-sided abdominal pain.  This began approximately 20 hours ago.  It has persisted and is sharp.  Associated nausea/vomiting.  No clear fever or chills.  Nothing makes her symptoms better or worse.  She has never had any like this before.  She denies any blood in her stool.  She denies any recent weight changes.  She states she has never had a colonoscopy but has recently had a Cologuard and reported this was negative.  She denies any family history of colorectal cancer, inflammatory bowel disease, Crohn's, or ulcerative colitis.  She does report her mother had pancreatic cancer.  She is not currently working. She is here today with her husband  Past Medical History:  Diagnosis Date  . Mixed hyperlipidemia     Past Surgical History:  Procedure Laterality Date  . CESAREAN SECTION     X2 10-20-1994 & 07-30-1997    Family History  Problem Relation Age of Onset  . Pancreatic cancer Mother   . COPD Father     Social:  reports that she has never smoked. She has never used smokeless tobacco. She reports current alcohol use. She reports that she does not use drugs.  Allergies:  Allergies  Allergen Reactions  . Lipitor [Atorvastatin]   . Rosuvastatin Calcium     Medications: I have reviewed the patient's current medications.  Results for orders placed or performed during the hospital encounter of 02/03/21 (from the past 48 hour(s))  Resp Panel by RT-PCR (Flu A&B, Covid) Nasopharyngeal Swab     Status: None   Collection Time: 02/03/21  1:59 PM   Specimen: Nasopharyngeal Swab; Nasopharyngeal(NP) swabs in vial transport medium  Result Value Ref Range   SARS Coronavirus 2 by RT PCR NEGATIVE NEGATIVE    Comment: (NOTE) SARS-CoV-2 target nucleic acids are NOT DETECTED.  The SARS-CoV-2  RNA is generally detectable in upper respiratory specimens during the acute phase of infection. The lowest concentration of SARS-CoV-2 viral copies this assay can detect is 138 copies/mL. A negative result does not preclude SARS-Cov-2 infection and should not be used as the sole basis for treatment or other patient management decisions. A negative result may occur with  improper specimen collection/handling, submission of specimen other than nasopharyngeal swab, presence of viral mutation(s) within the areas targeted by this assay, and inadequate number of viral copies(<138 copies/mL). A negative result must be combined with clinical observations, patient history, and epidemiological information. The expected result is Negative.  Fact Sheet for Patients:  BloggerCourse.comhttps://www.fda.gov/media/152166/download  Fact Sheet for Healthcare Providers:  SeriousBroker.ithttps://www.fda.gov/media/152162/download  This test is no t yet approved or cleared by the [place] FDA and  has been authorized for detection and/or diagnosis of SARS-CoV-2 by FDA under an Emergency Use Authorization (EUA). This EUA will remain  in effect (meaning this test can be used) for the duration of the COVID-19 declaration under Section 564(b)(1) of the Act, 21 U.S.C.section 360bbb-3(b)(1), unless the authorization is terminated  or revoked sooner.       Influenza A by PCR NEGATIVE NEGATIVE   Influenza B by PCR NEGATIVE NEGATIVE    Comment: (NOTE) The Xpert Xpress SARS-CoV-2/FLU/RSV plus assay is intended as an aid in the diagnosis of influenza from Nasopharyngeal swab specimens and should not be used as a sole basis for treatment.  Nasal washings and aspirates are unacceptable for Xpert Xpress SARS-CoV-2/FLU/RSV testing.  Fact Sheet for Patients: BloggerCourse.com  Fact Sheet for Healthcare Providers: SeriousBroker.it  This test is not yet approved or cleared by the [place]  FDA and has been authorized for detection and/or diagnosis of SARS-CoV-2 by FDA under an Emergency Use Authorization (EUA). This EUA will remain in effect (meaning this test can be used) for the duration of the COVID-19 declaration under Section 564(b)(1) of the Act, 21 U.S.C. section 360bbb-3(b)(1), unless the authorization is terminated or revoked.  Performed at [facility], [address], [place], [place] [address]   Comprehensive metabolic panel     Status: Abnormal   Collection Time: 02/03/21  2:00 PM  Result Value Ref Range   Sodium 132 (L) 135 - 145 mmol/L   Potassium 4.2 3.5 - 5.1 mmol/L   Chloride 96 (L) 98 - 111 mmol/L   CO2 26 22 - 32 mmol/L   Glucose, Bld 127 (H) 70 - 99 mg/dL    Comment: Glucose reference range applies only to samples taken after fasting for at least 8 hours.   BUN 11 6 - 20 mg/dL   Creatinine, Ser 6.04 0.44 - 1.00 mg/dL   Calcium 9.4 8.9 - 54.0 mg/dL   Total Protein 7.4 6.5 - 8.1 g/dL   Albumin 4.4 3.5 - 5.0 g/dL   AST 22 15 - 41 U/L   ALT 19 0 - 44 U/L   Alkaline Phosphatase 75 38 - 126 U/L   Total Bilirubin 0.9 0.3 - 1.2 mg/dL   GFR, Estimated >98 >11 mL/min    Comment: (NOTE) Calculated using the CKD-EPI Creatinine Equation (2021)    Anion gap 10 5 - 15    Comment: Performed at [facility], [address], [place], [place] [address]  CBC with Differential     Status: Abnormal   Collection Time: 02/03/21  2:00 PM  Result Value Ref Range   WBC 11.3 (H) 4.0 - 10.5 K/uL   RBC 4.25 3.87 - 5.11 MIL/uL   Hemoglobin 12.8 12.0 - 15.0 g/dL   HCT 29.5 62.1 - 30.8 %   MCV 88.0 80.0 - 100.0 fL   MCH 30.1 26.0 - 34.0 pg   MCHC 34.2 30.0 - 36.0 g/dL   RDW 65.7 84.6 - 96.2 %   Platelets 258 150 - 400 K/uL   nRBC 0.0 0.0 - 0.2 %   Neutrophils Relative % 89 %   Neutro Abs 10.1 (H) 1.7 - 7.7 K/uL   Lymphocytes Relative 4 %   Lymphs Abs 0.4 (L) 0.7 - 4.0 K/uL   Monocytes Relative 7 %   Monocytes Absolute 0.8  0.1 - 1.0 K/uL   Eosinophils Relative 0 %   Eosinophils Absolute 0.0 0.0 - 0.5 K/uL   Basophils Relative 0 %   Basophils Absolute 0.0 0.0 - 0.1 K/uL   Immature Granulocytes 0 %   Abs Immature Granulocytes 0.04 0.00 - 0.07 K/uL    Comment: Performed at [facility], [address] [place], [place] [address]  Lipase, blood     Status: None   Collection Time: 02/03/21  2:00 PM  Result Value Ref Range   Lipase 33 11 - 51 U/L    Comment: Performed at [facility], [address], [place], [place] [address]  Urinalysis, Routine w reflex microscopic     Status: Abnormal   Collection Time: 02/03/21  2:44 PM  Result  Value Ref Range   Color, Urine YELLOW YELLOW   APPearance CLEAR CLEAR   Specific Gravity, Urine 1.008 1.005 - 1.030   pH 7.0 5.0 - 8.0   Glucose, UA NEGATIVE NEGATIVE mg/dL   Hgb urine dipstick MODERATE (A) NEGATIVE   Bilirubin Urine NEGATIVE NEGATIVE   Ketones, ur 20 (A) NEGATIVE mg/dL   Protein, ur NEGATIVE NEGATIVE mg/dL   Nitrite NEGATIVE NEGATIVE   Leukocytes,Ua TRACE (A) NEGATIVE   RBC / HPF 0-5 0 - 5 RBC/hpf   WBC, UA 0-5 0 - 5 WBC/hpf   Bacteria, UA RARE (A) NONE SEEN   Squamous Epithelial / LPF 6-10 0 - 5   Mucus PRESENT     Comment: Performed at [facility], [address], [place], [place] [address]    CT Abdomen Pelvis W Contrast  Result Date: 02/03/2021 CLINICAL DATA:  55 year old female with history of right lower quadrant abdominal pain since yesterday evening. Fevers, chills and diarrhea. EXAM: CT ABDOMEN AND PELVIS WITH CONTRAST TECHNIQUE: Multidetector CT imaging of the abdomen and pelvis was performed using the standard protocol following bolus administration of intravenous contrast. CONTRAST:  OMNIPAQUE IOHEXOL 300 MG/ML  SOLN COMPARISON:  No priors. FINDINGS: Lower chest: Unremarkable. Hepatobiliary: Well-defined 1.8 cm low-attenuation lesion in segment 3 of the liver, compatible with a simple  cyst. No other suspicious hepatic lesions. No intra or extrahepatic biliary ductal dilatation. Gallbladder is normal in appearance. Pancreas: No pancreatic mass. No pancreatic ductal dilatation. No pancreatic or peripancreatic fluid collections or inflammatory changes. Spleen: Unremarkable. Adrenals/Urinary Tract: Bilateral kidneys and adrenal glands are normal in appearance. No hydroureteronephrosis. Urinary bladder is normal in appearance. Stomach/Bowel: Normal appearance of the stomach. No pathologic dilatation of small bowel or colon. Numerous colonic diverticulae are noted, without surrounding inflammatory changes to suggest an acute diverticulitis at this time. Evidence of acute appendicitis. Appendix: Location: Anterior to the cecum Diameter: 16 mm Appendicolith: Present at the ostium Mucosal hyper-enhancement: Present Extraluminal gas: None. Periappendiceal collection: Extensive periappendiceal inflammatory changes without a well-defined abscess. Vascular/Lymphatic: No significant atherosclerotic disease, aneurysm or dissection noted in the abdominal or pelvic vasculature. No lymphadenopathy noted in the abdomen or pelvis. Reproductive: Uterus and ovaries are unremarkable in appearance. Other: No significant volume of ascites.  No pneumoperitoneum. Musculoskeletal: There are no aggressive appearing lytic or blastic lesions noted in the visualized portions of the skeleton. IMPRESSION: 1. Findings are compatible with acute appendicitis, as detailed above. Extensive periappendiceal phlegmonous inflammation without definite periappendiceal abscess or signs of frank perforation noted at this time. Surgical consultation is recommended. Critical Value/emergent results were called by telephone at the time of interpretation on 02/03/2021 at 4:14 pm to provider [place] Robinson, who verbally acknowledged these results. Electronically Signed   By: Trudie Reed M.D.   On: 02/03/2021 16:18   DG Chest Portable 1  View  Result Date: 02/03/2021 CLINICAL DATA:  Cough. EXAM: PORTABLE CHEST 1 VIEW COMPARISON:  No recent prior. FINDINGS: Heart size normal. Mild right base infiltrate cannot be excluded. No pleural effusion or pneumothorax. No acute bony abnormality. IMPRESSION: Mild right base infiltrate cannot be excluded. Electronically Signed   By: Maisie Fus  Register   On: 02/03/2021 14:34    ROS - all of the below systems have been reviewed with the patient and positives are indicated with bold text General: chills, fever or night sweats Eyes: blurry vision or double vision ENT: epistaxis or sore throat Allergy/Immunology: itchy/watery eyes or nasal congestion Hematologic/Lymphatic: bleeding problems, blood clots or  swollen lymph nodes Endocrine: temperature intolerance or unexpected weight changes Breast: new or changing breast lumps or nipple discharge Resp: cough, shortness of breath, or wheezing CV: chest pain or dyspnea on exertion GI: as per HPI GU: dysuria, trouble voiding, or hematuria MSK: joint pain or joint stiffness Neuro: TIA or stroke symptoms Derm: pruritus and skin lesion changes Psych: anxiety and depression  PE Blood pressure 117/79, pulse 85, temperature 98.5 F (36.9 C), temperature source Oral, resp. rate 16, SpO2 95 %. Constitutional: NAD; conversant; wearing mask Eyes: Moist conjunctiva; no lid lag; anicteric; pupils equal and round Neck: Trachea midline; no thyromegaly Lungs: Normal respiratory effort; no tactile fremitus CV: RRR; no palpable thrills; no pitting edema GI: Abd soft, tender along right hemiabdomen without rebound nor guarding; no left sided tenderness; nondistended; no palpable hepatosplenomegaly MSK: Normal range of motion of extremities; no clubbing/cyanosis Psychiatric: Appropriate affect; alert and oriented x3 Lymphatic: No palpable cervical or axillary lymphadenopathy  Results for orders placed or performed during the hospital encounter of 02/03/21  (from the past 48 hour(s))  Resp Panel by RT-PCR (Flu A&B, Covid) Nasopharyngeal Swab     Status: None   Collection Time: 02/03/21  1:59 PM   Specimen: Nasopharyngeal Swab; Nasopharyngeal(NP) swabs in vial transport medium  Result Value Ref Range   SARS Coronavirus 2 by RT PCR NEGATIVE NEGATIVE    Comment: (NOTE) SARS-CoV-2 target nucleic acids are NOT DETECTED.  The SARS-CoV-2 RNA is generally detectable in upper respiratory specimens during the acute phase of infection. The lowest concentration of SARS-CoV-2 viral copies this assay can detect is 138 copies/mL. A negative result does not preclude SARS-Cov-2 infection and should not be used as the sole basis for treatment or other patient management decisions. A negative result may occur with  improper specimen collection/handling, submission of specimen other than nasopharyngeal swab, presence of viral mutation(s) within the areas targeted by this assay, and inadequate number of viral copies(<138 copies/mL). A negative result must be combined with clinical observations, patient history, and epidemiological information. The expected result is Negative.  Fact Sheet for Patients:  BloggerCourse.com  Fact Sheet for Healthcare Providers:  SeriousBroker.it  This test is no t yet approved or cleared by the [place] FDA and  has been authorized for detection and/or diagnosis of SARS-CoV-2 by FDA under an Emergency Use Authorization (EUA). This EUA will remain  in effect (meaning this test can be used) for the duration of the COVID-19 declaration under Section 564(b)(1) of the Act, 21 U.S.C.section 360bbb-3(b)(1), unless the authorization is terminated  or revoked sooner.       Influenza A by PCR NEGATIVE NEGATIVE   Influenza B by PCR NEGATIVE NEGATIVE    Comment: (NOTE) The Xpert Xpress SARS-CoV-2/FLU/RSV plus assay is intended as an aid in the diagnosis of influenza from  Nasopharyngeal swab specimens and should not be used as a sole basis for treatment. Nasal washings and aspirates are unacceptable for Xpert Xpress SARS-CoV-2/FLU/RSV testing.  Fact Sheet for Patients: BloggerCourse.com  Fact Sheet for Healthcare Providers: SeriousBroker.it  This test is not yet approved or cleared by the [place] FDA and has been authorized for detection and/or diagnosis of SARS-CoV-2 by FDA under an Emergency Use Authorization (EUA). This EUA will remain in effect (meaning this test can be used) for the duration of the COVID-19 declaration under Section 564(b)(1) of the Act, 21 U.S.C. section 360bbb-3(b)(1), unless the authorization is terminated or revoked.  Performed at [facility], [address]  [place], [place] [address]   Comprehensive metabolic panel     Status: Abnormal   Collection Time: 02/03/21  2:00 PM  Result Value Ref Range   Sodium 132 (L) 135 - 145 mmol/L   Potassium 4.2 3.5 - 5.1 mmol/L   Chloride 96 (L) 98 - 111 mmol/L   CO2 26 22 - 32 mmol/L   Glucose, Bld 127 (H) 70 - 99 mg/dL    Comment: Glucose reference range applies only to samples taken after fasting for at least 8 hours.   BUN 11 6 - 20 mg/dL   Creatinine, Ser 1.75 0.44 - 1.00 mg/dL   Calcium 9.4 8.9 - 10.2 mg/dL   Total Protein 7.4 6.5 - 8.1 g/dL   Albumin 4.4 3.5 - 5.0 g/dL   AST 22 15 - 41 U/L   ALT 19 0 - 44 U/L   Alkaline Phosphatase 75 38 - 126 U/L   Total Bilirubin 0.9 0.3 - 1.2 mg/dL   GFR, Estimated >58 >52 mL/min    Comment: (NOTE) Calculated using the CKD-EPI Creatinine Equation (2021)    Anion gap 10 5 - 15    Comment: Performed at [facility], [address], [place], [place] [address]  CBC with Differential     Status: Abnormal   Collection Time: 02/03/21  2:00 PM  Result Value Ref Range   WBC 11.3 (H) 4.0 - 10.5 K/uL   RBC 4.25 3.87 - 5.11 MIL/uL   Hemoglobin 12.8  12.0 - 15.0 g/dL   HCT 23.5 36.1 - 44.3 %   MCV 88.0 80.0 - 100.0 fL   MCH 30.1 26.0 - 34.0 pg   MCHC 34.2 30.0 - 36.0 g/dL   RDW 15.4 00.8 - 67.6 %   Platelets 258 150 - 400 K/uL   nRBC 0.0 0.0 - 0.2 %   Neutrophils Relative % 89 %   Neutro Abs 10.1 (H) 1.7 - 7.7 K/uL   Lymphocytes Relative 4 %   Lymphs Abs 0.4 (L) 0.7 - 4.0 K/uL   Monocytes Relative 7 %   Monocytes Absolute 0.8 0.1 - 1.0 K/uL   Eosinophils Relative 0 %   Eosinophils Absolute 0.0 0.0 - 0.5 K/uL   Basophils Relative 0 %   Basophils Absolute 0.0 0.0 - 0.1 K/uL   Immature Granulocytes 0 %   Abs Immature Granulocytes 0.04 0.00 - 0.07 K/uL    Comment: Performed at [facility], [address] [place], [place] [address]  Lipase, blood     Status: None   Collection Time: 02/03/21  2:00 PM  Result Value Ref Range   Lipase 33 11 - 51 U/L    Comment: Performed at [facility], [address] [place], [place] [address]  Urinalysis, Routine w reflex microscopic     Status: Abnormal   Collection Time: 02/03/21  2:44 PM  Result Value Ref Range   Color, Urine YELLOW YELLOW   APPearance CLEAR CLEAR   Specific Gravity, Urine 1.008 1.005 - 1.030   pH 7.0 5.0 - 8.0   Glucose, UA NEGATIVE NEGATIVE mg/dL   Hgb urine dipstick MODERATE (A) NEGATIVE   Bilirubin Urine NEGATIVE NEGATIVE   Ketones, ur 20 (A) NEGATIVE mg/dL   Protein, ur NEGATIVE NEGATIVE mg/dL   Nitrite NEGATIVE NEGATIVE   Leukocytes,Ua TRACE (A) NEGATIVE   RBC / HPF 0-5 0 - 5 RBC/hpf   WBC, UA 0-5 0 - 5 WBC/hpf   Bacteria, UA RARE (A) NONE SEEN  Squamous Epithelial / LPF 6-10 0 - 5   Mucus PRESENT     Comment: Performed at [facility], [address] [place], [place] [address]    CT Abdomen Pelvis W Contrast  Result Date: 02/03/2021 CLINICAL DATA:  55 year old female with history of right lower quadrant abdominal pain since yesterday evening. Fevers, chills and diarrhea. EXAM: CT ABDOMEN AND PELVIS  WITH CONTRAST TECHNIQUE: Multidetector CT imaging of the abdomen and pelvis was performed using the standard protocol following bolus administration of intravenous contrast. CONTRAST:  OMNIPAQUE IOHEXOL 300 MG/ML  SOLN COMPARISON:  No priors. FINDINGS: Lower chest: Unremarkable. Hepatobiliary: Well-defined 1.8 cm low-attenuation lesion in segment 3 of the liver, compatible with a simple cyst. No other suspicious hepatic lesions. No intra or extrahepatic biliary ductal dilatation. Gallbladder is normal in appearance. Pancreas: No pancreatic mass. No pancreatic ductal dilatation. No pancreatic or peripancreatic fluid collections or inflammatory changes. Spleen: Unremarkable. Adrenals/Urinary Tract: Bilateral kidneys and adrenal glands are normal in appearance. No hydroureteronephrosis. Urinary bladder is normal in appearance. Stomach/Bowel: Normal appearance of the stomach. No pathologic dilatation of small bowel or colon. Numerous colonic diverticulae are noted, without surrounding inflammatory changes to suggest an acute diverticulitis at this time. Evidence of acute appendicitis. Appendix: Location: Anterior to the cecum Diameter: 16 mm Appendicolith: Present at the ostium Mucosal hyper-enhancement: Present Extraluminal gas: None. Periappendiceal collection: Extensive periappendiceal inflammatory changes without a well-defined abscess. Vascular/Lymphatic: No significant atherosclerotic disease, aneurysm or dissection noted in the abdominal or pelvic vasculature. No lymphadenopathy noted in the abdomen or pelvis. Reproductive: Uterus and ovaries are unremarkable in appearance. Other: No significant volume of ascites.  No pneumoperitoneum. Musculoskeletal: There are no aggressive appearing lytic or blastic lesions noted in the visualized portions of the skeleton. IMPRESSION: 1. Findings are compatible with acute appendicitis, as detailed above. Extensive periappendiceal phlegmonous inflammation without definite  periappendiceal abscess or signs of frank perforation noted at this time. Surgical consultation is recommended. Critical Value/emergent results were called by telephone at the time of interpretation on 02/03/2021 at 4:14 pm to provider [place] Robinson, who verbally acknowledged these results. Electronically Signed   By: Trudie Reed M.D.   On: 02/03/2021 16:18   DG Chest Portable 1 View  Result Date: 02/03/2021 CLINICAL DATA:  Cough. EXAM: PORTABLE CHEST 1 VIEW COMPARISON:  No recent prior. FINDINGS: Heart size normal. Mild right base infiltrate cannot be excluded. No pleural effusion or pneumothorax. No acute bony abnormality. IMPRESSION: Mild right base infiltrate cannot be excluded. Electronically Signed   By: Maisie Fus  Register   On: 02/03/2021 14:34   A/P: DALIAH CHAUDOIN is an 55 y.o. female with acute appendicitis - no evident perforation or abscess appreciated on CT scan  -The anatomy and physiology of the GI tract was reviewed today with the her and her husband. The pathophysiology of appendicitis was discussed as well. -We reviewed options moving forward for treatment, covering IV abx vs surgery. We discussed that with antibiotics alone, there is reasonable success in managing appendicitis, however, risks of recurrence at 62yrs being as high as 40% in some studies. We discussed appendectomy - laparoscopic and potential open techniques as well as scenarios where an ileocecectomy could be necessary. We discussed the material risks (including, but not limited to, pain, bleeding, infection, scarring, need for blood transfusion, damage to surrounding structures- blood vessels/nerves/viscus/organs, damage to ureter/bladder, urine leak, leak from staple line, need for additional procedures, hernia, recurrence although quite low, pneumonia, heart attack, stroke, death) benefits and alternatives  to surgery were discussed. The patient's questions were answered to her satisfaction, she voiced  understanding and elected to proceed with surgery. Additionally, we discussed typical postoperative expectations and the recovery process.  Marin Olp, MD [facility] Use [email or web address] to contact on call provider

## 2021-02-03 NOTE — Transfer of Care (Signed)
Immediate Anesthesia Transfer of Care Note  Patient: Joann Jenkins  Procedure(s) Performed: APPENDECTOMY LAPAROSCOPIC (N/A )  Patient Location: PACU  Anesthesia Type:General  Level of Consciousness: sedated, patient cooperative and responds to stimulation  Airway & Oxygen Therapy: Patient Spontanous Breathing and Patient connected to face mask oxygen  Post-op Assessment: Report given to RN and Post -op Vital signs reviewed and stable  Post vital signs: Reviewed and stable  Last Vitals:  Vitals Value Taken Time  BP    Temp    Pulse 79 02/03/21 2027  Resp 21 02/03/21 2027  SpO2 100 % 02/03/21 2027  Vitals shown include unvalidated device data.  Last Pain:  Vitals:   02/03/21 1840  TempSrc: Oral  PainSc: 4          Complications: No complications documented.

## 2021-02-03 NOTE — ED Provider Notes (Signed)
[facility]-EMERGENCY DEPT Provider Note   CSN: [id number] Arrival date & time: 02/03/21  1308     History Chief Complaint  Patient presents with  . Emesis  . Abdominal Pain    Joann Jenkins is a 55 y.o. female with history of hyperlipidemia and prior C-section who presents to the emergency department with complaints abdominal pain since last night.  Patient states that her abdominal pain is located in the right lower quadrant, it is constant, worse with movement, no alleviating factors.  She has had associated nausea with too numerous to count episodes of emesis as well as a few episodes of diarrhea with subjective fever and chills.  Also has noted a mild cough.  She is concerned about her appendix which prompted her emergency department visit.  She denies chest pain, hematemesis, melena, hematochezia, dysuria, vaginal bleeding, or vaginal discharge.  Patient is postmenopausal.  She denies concern for STI.  HPI     Past Medical History:  Diagnosis Date  . Mixed hyperlipidemia     Patient Active Problem List   Diagnosis Date Noted  . BMI 26.0-26.9,adult 06/23/2020  . Primary hypothyroidism 06/23/2020  . Abnormal blood chemistry 12/18/2019  . Mixed hyperlipidemia 12/18/2019    Past Surgical History:  Procedure Laterality Date  . CESAREAN SECTION     X2 10-20-1994 & 07-30-1997     OB History   No obstetric history on file.     Family History  Problem Relation Age of Onset  . Pancreatic cancer Mother   . COPD Father     Social History   Tobacco Use  . Smoking status: Never Smoker  . Smokeless tobacco: Never Used  Vaping Use  . Vaping Use: Never used  Substance Use Topics  . Alcohol use: Yes    Comment: Drinks approximately 2-3 times per week. She typically consumes wine.  . Drug use: Never    Home Medications Prior to Admission medications   Medication Sig Start Date End Date Taking? Authorizing Provider  ezetimibe (ZETIA) 10 MG  tablet Take 1 tablet (10 mg total) by mouth daily. 06/23/20   Abigail Miyamoto, MD  levothyroxine (SYNTHROID) 88 MCG tablet Take 1 tablet (88 mcg total) by mouth daily before breakfast. 06/23/20   Abigail Miyamoto, MD  zolpidem (AMBIEN) 5 MG tablet Take 1 tablet (5 mg total) by mouth at bedtime as needed for sleep. 06/23/20   Abigail Miyamoto, MD    Allergies    Lipitor [atorvastatin] and Rosuvastatin calcium  Review of Systems   Review of Systems  Constitutional: Positive for chills and fever.  Respiratory: Positive for cough. Negative for shortness of breath.   Cardiovascular: Negative for chest pain.  Gastrointestinal: Positive for abdominal pain, diarrhea, nausea and vomiting. Negative for blood in stool.  Genitourinary: Negative for dysuria, vaginal bleeding and vaginal discharge.  Neurological: Negative for syncope.  All other systems reviewed and are negative.   Physical Exam Updated Vital Signs BP 136/71 (BP Location: Left Arm)   Pulse 71   Temp 99.2 F (37.3 C) (Oral)   Resp 16   SpO2 98%   Physical Exam Vitals and nursing note reviewed.  Constitutional:      General: She is not in acute distress.    Appearance: She is well-developed. She is not toxic-appearing.  HENT:     Head: Normocephalic and atraumatic.  Eyes:     General:        Right eye: No discharge.  Left eye: No discharge.     Conjunctiva/sclera: Conjunctivae normal.  Cardiovascular:     Rate and Rhythm: Normal rate and regular rhythm.  Pulmonary:     Effort: Pulmonary effort is normal. No respiratory distress.     Breath sounds: Normal breath sounds. No wheezing, rhonchi or rales.  Abdominal:     General: There is no distension.     Palpations: Abdomen is soft.     Tenderness: There is abdominal tenderness (To the lower abdomen more prominent in the right lower quadrant.).  Musculoskeletal:     Cervical back: Neck supple.  Skin:    General: Skin is warm and dry.      Findings: No rash.  Neurological:     Mental Status: She is alert.     Comments: Clear speech.   Psychiatric:        Behavior: Behavior normal.     ED Results / Procedures / Treatments   Labs (all labs ordered are listed, but only abnormal results are displayed) Labs Reviewed  COMPREHENSIVE METABOLIC PANEL - Abnormal; Notable for the following components:      Result Value   Sodium 132 (*)    Chloride 96 (*)    Glucose, Bld 127 (*)    All other components within normal limits  CBC WITH DIFFERENTIAL/PLATELET - Abnormal; Notable for the following components:   WBC 11.3 (*)    Neutro Abs 10.1 (*)    Lymphs Abs 0.4 (*)    All other components within normal limits  RESP PANEL BY RT-PCR (FLU A&B, COVID) ARPGX2  LIPASE, BLOOD  URINALYSIS, ROUTINE W REFLEX MICROSCOPIC    EKG None  Radiology DG Chest Portable 1 View  Result Date: 02/03/2021 CLINICAL DATA:  Cough. EXAM: PORTABLE CHEST 1 VIEW COMPARISON:  No recent prior. FINDINGS: Heart size normal. Mild right base infiltrate cannot be excluded. No pleural effusion or pneumothorax. No acute bony abnormality. IMPRESSION: Mild right base infiltrate cannot be excluded. Electronically Signed   By: Maisie Fus  Register   On: 02/03/2021 14:34    Procedures Procedures   Medications Ordered in ED Medications  sodium chloride 0.9 % bolus 1,000 mL (1,000 mLs Intravenous New Bag/Given 02/03/21 1355)  ondansetron (ZOFRAN) injection 4 mg (4 mg Intravenous Given 02/03/21 1355)  morphine 4 MG/ML injection 4 mg (4 mg Intravenous Given 02/03/21 1358)    ED Course  I have reviewed the triage vital signs and the nursing notes.  Pertinent labs & imaging results that were available during my care of the patient were reviewed by me and considered in my medical decision making (see chart for details).    MDM Rules/Calculators/A&P                          Patient presents to the ED with complaints of abdominal pain. Patient nontoxic appearing, in no  apparent distress, vitals WNL. On exam patient tender to the lower abdomen- more so in the RLQ, no peritoneal signs. Will evaluate with labs and CT A/P. Analgesics, anti-emetics, and fluids administered.   Additional history obtained:  Additional history obtained from chart review & nursing note review.   Lab Tests:  I Ordered, reviewed, and interpreted labs, which included:  CBC: Mild leukocytosis @ 11.3.  CMP: Mild hyponatremia/chloremia. Renal function/LFTs WNL.  Lipase: WNL UA: Pending No preg test- post menopausal.   Imaging Studies ordered:  I ordered imaging studies which included CXR & CT A/P, I independently reviewed, formal  radiology impression shows:  CXR: Mild right base infiltrate cannot be excluded CT A/P: Pending  15:10: Patient care signed out to [place] Robinson PA-C at change of shift pending remaining work-up and disposition.   Portions of this note were generated with Scientist, clinical (histocompatibility and immunogenetics). Dictation errors may occur despite best attempts at proofreading.   Final Clinical Impression(s) / ED Diagnoses Final diagnoses:  None    Rx / DC Orders ED Discharge Orders    None       Cherly Anderson, PA-C 02/03/21 1514    Milagros Loll, MD 02/05/21 2006

## 2021-02-03 NOTE — ED Notes (Signed)
Patient complaining of headache at this time.

## 2021-02-04 ENCOUNTER — Encounter (HOSPITAL_COMMUNITY): Payer: Self-pay | Admitting: Surgery

## 2021-02-04 LAB — CBC WITH DIFFERENTIAL/PLATELET
Abs Immature Granulocytes: 0.04 10*3/uL (ref 0.00–0.07)
Basophils Absolute: 0 10*3/uL (ref 0.0–0.1)
Basophils Relative: 0 %
Eosinophils Absolute: 0 10*3/uL (ref 0.0–0.5)
Eosinophils Relative: 0 %
HCT: 32.4 % — ABNORMAL LOW (ref 36.0–46.0)
Hemoglobin: 10.9 g/dL — ABNORMAL LOW (ref 12.0–15.0)
Immature Granulocytes: 0 %
Lymphocytes Relative: 5 %
Lymphs Abs: 0.4 10*3/uL — ABNORMAL LOW (ref 0.7–4.0)
MCH: 30.6 pg (ref 26.0–34.0)
MCHC: 33.6 g/dL (ref 30.0–36.0)
MCV: 91 fL (ref 80.0–100.0)
Monocytes Absolute: 0.4 10*3/uL (ref 0.1–1.0)
Monocytes Relative: 4 %
Neutro Abs: 8.4 10*3/uL — ABNORMAL HIGH (ref 1.7–7.7)
Neutrophils Relative %: 91 %
Platelets: 221 10*3/uL (ref 150–400)
RBC: 3.56 MIL/uL — ABNORMAL LOW (ref 3.87–5.11)
RDW: 12.7 % (ref 11.5–15.5)
WBC: 9.2 10*3/uL (ref 4.0–10.5)
nRBC: 0 % (ref 0.0–0.2)

## 2021-02-04 LAB — HIV ANTIBODY (ROUTINE TESTING W REFLEX): HIV Screen 4th Generation wRfx: NONREACTIVE

## 2021-02-04 MED ORDER — TRAMADOL HCL 50 MG PO TABS: 50.0000 mg | ORAL_TABLET | Freq: Four times a day (QID) | ORAL | 0 refills | Status: DC | PRN

## 2021-02-04 MED ORDER — ACETAMINOPHEN 500 MG PO TABS: 1000.0000 mg | ORAL_TABLET | Freq: Three times a day (TID) | ORAL | 0 refills | Status: AC | PRN

## 2021-02-04 NOTE — Discharge Summary (Signed)
     Patient ID: Joann Jenkins [id number] 09-Apr-1966 55 y.o.  Admit date: 02/03/2021 Discharge date: 02/04/2021  Admitting Diagnosis: Acute appendicitis without evident perforation  Discharge Diagnosis Acute gangrenous appendicitis  Consultants None  Reason for Admission: Joann Jenkins is an 55 y.o. female with hx of HLD presented to the emergency department today with a 1 day history of right-sided abdominal pain.  This began approximately 20 hours ago.  It has persisted and is sharp.  Associated nausea/vomiting.  No clear fever or chills.  Nothing makes her symptoms better or worse.  She has never had any like this before.  She denies any blood in her stool.  She denies any recent weight changes.  She states she has never had a colonoscopy but has recently had a Cologuard and reported this was negative.  She denies any family history of colorectal cancer, inflammatory bowel disease, Crohn's, or ulcerative colitis.  She does report her mother had pancreatic cancer.  She is not currently working. She is here today with her husband  Procedures Dr. Cliffton Asters - Laparoscopic Appendectomy - 02/03/2021  Hospital Course:  The patient was admitted and underwent a laparoscopic appendectomy.  The patient tolerated the procedure well.  On POD 1, the patient was tolerating a regular diet, voiding well, mobilizing, and pain was controlled with oral pain medications.  The patient was stable for DC home at this time with appropriate follow up made. Return precautions discussed.  Physical Exam: Gen:  Alert, NAD, pleasant HEENT: EOM's intact, pupils equal and round Card:  RRR Pulm:  CTAB, no W/R/R, effort normal Abd: Soft, ND, appropriately tender around laparoscopic incisions, +BS, Incisions with glue intact appears well and are without drainage, bleeding, or signs of infection Ext:  No LE edema  Psych: A&Ox3  Skin: no rashes noted, warm and dry  Allergies as of 02/04/2021       Reactions   Lipitor [atorvastatin]    Rosuvastatin Calcium       Medication List    TAKE these medications   acetaminophen 500 MG tablet Commonly known as: TYLENOL Take 2 tablets (1,000 mg total) by mouth every 8 (eight) hours as needed.   ezetimibe 10 MG tablet Commonly known as: ZETIA Take 1 tablet (10 mg total) by mouth daily.   levothyroxine 88 MCG tablet Commonly known as: SYNTHROID Take 1 tablet (88 mcg total) by mouth daily before breakfast.   traMADol 50 MG tablet Commonly known as: ULTRAM Take 1 tablet (50 mg total) by mouth every 6 (six) hours as needed (breakthrough pain).   zolpidem 5 MG tablet Commonly known as: AMBIEN Take 1 tablet (5 mg total) by mouth at bedtime as needed for sleep.         Follow-up Information    Sur[facility], [facility]. Go on 02/23/2021.   Specialty: General Surgery Why: 945am. Please arrive 30 minutes prior to your appointment for paperwork. Please bring a copy of your photo ID and insurance card.  Contact information: [address] [place] [address] [phone]               Signed: Leary Roca, [facility] 02/04/2021, 8:54 AM Please see Amion for pager number during day hours 7:00am-4:30pm

## 2021-02-04 NOTE — Discharge Instructions (Signed)
[facility] ° °Please arrive at least 30 min before your appointment to complete your check in paperwork.  If you are unable to arrive 30 min prior to your appointment time we may have to cancel or reschedule you. °LAPAROSCOPIC SURGERY: POST OP INSTRUCTIONS °Always review your discharge instruction sheet given to you by the facility where your surgery was performed. °IF YOU HAVE DISABILITY OR FAMILY LEAVE FORMS, YOU MUST BRING THEM TO THE OFFICE FOR PROCESSING.   °DO NOT GIVE THEM TO YOUR DOCTOR. ° °PAIN CONTROL ° °1. First take acetaminophen (Tylenol) AND/or ibuprofen (Advil) to control your pain after surgery.  Follow directions on package.  Taking acetaminophen (Tylenol) and/or ibuprofen (Advil) regularly after surgery will help to control your pain and lower the amount of prescription pain medication you may need.  You should not take more than 4,000 mg (4 grams) of acetaminophen (Tylenol) in 24 hours.  You should not take ibuprofen (Advil), aleve, motrin, naprosyn or other NSAIDS if you have a history of stomach ulcers or chronic kidney disease.  °2. A prescription for pain medication may be given to you upon discharge.  Take your pain medication as prescribed, if you still have uncontrolled pain after taking acetaminophen (Tylenol) or ibuprofen (Advil). °3. Use ice packs to help control pain. °4. If you need a refill on your pain medication, please contact your pharmacy.  They will contact our office to request authorization. Prescriptions will not be filled after 5pm or on week-ends. ° °HOME MEDICATIONS °5. Take your usually prescribed medications unless otherwise directed. ° °DIET °6. You should follow a light diet the first few days after arrival home.  Be sure to include lots of fluids daily. Avoid fatty, fried foods.  ° °CONSTIPATION °7. It is common to experience some constipation after surgery and if you are taking pain medication.  Increasing fluid intake and taking a stool  softener (such as Colace) will usually help or prevent this problem from occurring.  A mild laxative (Milk of Magnesia or Miralax) should be taken according to package instructions if there are no bowel movements after 48 hours. ° °WOUND/INCISION CARE °8. Most patients will experience some swelling and bruising in the area of the incisions.  Ice packs will help.  Swelling and bruising can take several days to resolve.  °9. Unless discharge instructions indicate otherwise, follow guidelines below  °a. STERI-STRIPS - you may remove your outer bandages 48 hours after surgery, and you may shower at that time.  You have steri-strips (small skin tapes) in place directly over the incision.  These strips should be left on the skin for 7-10 days.   °b. DERMABOND/SKIN GLUE - you may shower in 24 hours.  The glue will flake off over the next 2-3 weeks. °10. Any sutures or staples will be removed at the office during your follow-up visit. ° °ACTIVITIES °11. You may resume regular (light) daily activities beginning the next day--such as daily self-care, walking, climbing stairs--gradually increasing activities as tolerated.  You may have sexual intercourse when it is comfortable.  Refrain from any heavy lifting or straining until approved by your doctor. °a. You may drive when you are no longer taking prescription pain medication, you can comfortably wear a seatbelt, and you can safely maneuver your car and apply brakes. ° °FOLLOW-UP °12. You should see your doctor in the office for a follow-up appointment approximately 2-3 weeks after your surgery.  You should have been given your post-op/follow-up appointment when   your surgery was scheduled.  If you did not receive a post-op/follow-up appointment, make sure that you call for this appointment within a day or two after you arrive home to insure a convenient appointment time. ° ° °WHEN TO CALL YOUR DOCTOR: °1. Fever over 101.0 °2. Inability to urinate °3. Continued bleeding from  incision. °4. Increased pain, redness, or drainage from the incision. °5. Increasing abdominal pain ° °The clinic staff is available to answer your questions during regular business hours.  Please don’t hesitate to call and ask to speak to one of the nurses for clinical concerns.  If you have a medical emergency, go to the nearest emergency room or call 911.  A surgeon from [facility] is always on call at the hospital. °[address], [address], [place], [place]  [address] ? [address] [address], [place], [place]   [address] °[phone] ? [phone] ? FAX [phone] ° °

## 2021-02-04 NOTE — Progress Notes (Signed)
Patient was given discharge instructions, and all questions were answered.  Patient was stable for discharge and was taken to the main exit by wheelchair. 

## 2021-02-04 NOTE — ED Provider Notes (Signed)
Care assumed at shift change from [facility], PA-C, pending CT results and disposition. See their note for full HPI and workup. Briefly, pt presenting with N/V throughout the night, RLQ abd pain. CT pending to eval for acute appy.  Physical Exam  BP 104/73 (BP Location: Right Arm)   Pulse (!) 57   Temp 98.3 F (36.8 C) (Oral)   Resp 16   Ht 5\' 2"  (1.575 m)   Wt 65.8 kg   SpO2 99%   BMI 26.52 kg/m   Physical Exam Vitals and nursing note reviewed.  Constitutional:      General: She is not in acute distress.    Appearance: She is well-developed.  HENT:     Head: Normocephalic and atraumatic.  Eyes:     Conjunctiva/sclera: Conjunctivae normal.  Cardiovascular:     Rate and Rhythm: Normal rate.  Pulmonary:     Effort: Pulmonary effort is normal.  Neurological:     Mental Status: She is alert.  Psychiatric:        Mood and Affect: Mood normal.        Behavior: Behavior normal.     ED Course/Procedures   Clinical Course as of 02/04/21 1500  Wed Feb 03, 2021  1618 Acute appendicitis per radiologist. No findings in right lung base. [JR]  1620 Pt doing well with pain. Informed of CT results and plan for surgery consultation. NPO since 730pm last night. Reports HA now, hx of migraines will order reglan.  [JR]    Clinical Course User Index [JR] Alyce Inscore, 1619 N, PA-C    Procedures  MDM  Dr. [place] with general surgery consulted, will admit and take to OR.       Truly Stankiewicz, Cliffton Asters N, PA-C 02/04/21 1502    04/06/21, MD 02/05/21 2006

## 2021-02-05 LAB — SURGICAL PATHOLOGY

## 2021-07-17 ENCOUNTER — Other Ambulatory Visit: Payer: Self-pay | Admitting: Legal Medicine

## 2021-07-17 DIAGNOSIS — E039 Hypothyroidism, unspecified: Secondary | ICD-10-CM

## 2021-10-06 LAB — COLOGUARD: COLOGUARD: NEGATIVE

## 2021-10-06 LAB — EXTERNAL GENERIC LAB PROCEDURE: COLOGUARD: NEGATIVE

## 2021-10-15 ENCOUNTER — Other Ambulatory Visit: Payer: Self-pay | Admitting: Legal Medicine

## 2021-10-15 DIAGNOSIS — E039 Hypothyroidism, unspecified: Secondary | ICD-10-CM

## 2022-01-06 IMAGING — CT CT ABD-PELV W/ CM
3 of 6 series · 16 of 46 positions shown, 18 images · IV contrast (OMNIPAQUE 300)
Comparison: No priors.

CLINICAL DATA: 55-year-old female with history of right lower
quadrant abdominal pain since yesterday evening. Fevers, chills and
diarrhea.

EXAM:
CT ABDOMEN AND PELVIS WITH CONTRAST
TECHNIQUE: Multidetector CT imaging of the abdomen and pelvis was performed
using the standard protocol following bolus administration of
intravenous contrast.
CONTRAST:  100mL OMNIPAQUE IOHEXOL 300 MG/ML  SOLN

[Series 2: axial st · axial · 0.73mm/px · z∈[-169,+186]mm · 11 of 87 slices shown, 13 images (1 of 2)]
[im 8/87  soft-tissue]
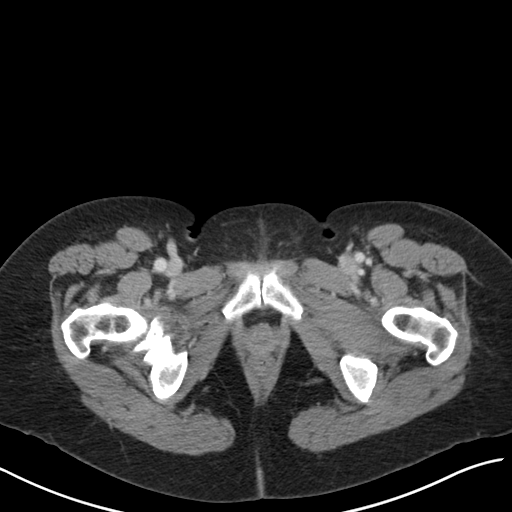
[im 8/87  bone]
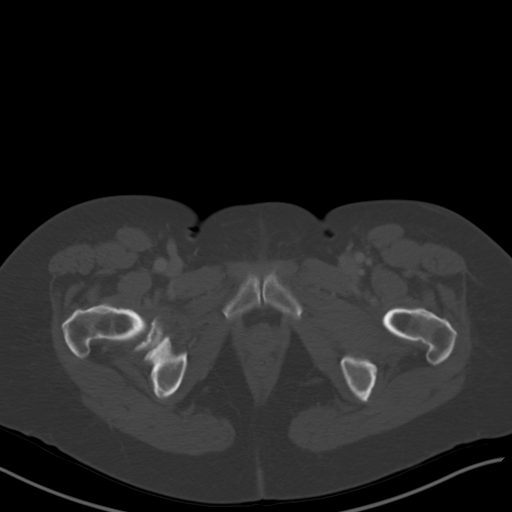
[im 15/87  soft-tissue]
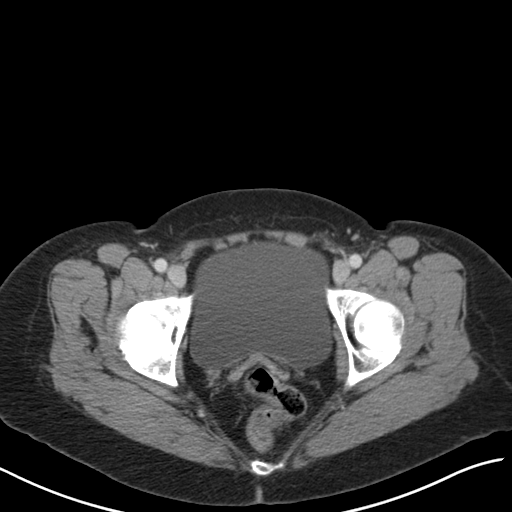
[im 22/87  soft-tissue]
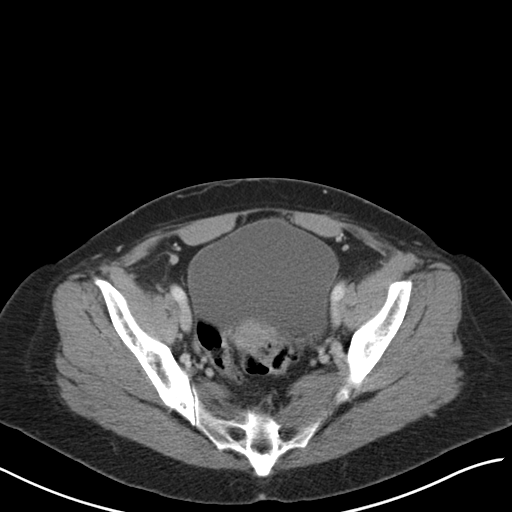
[im 29/87  soft-tissue]
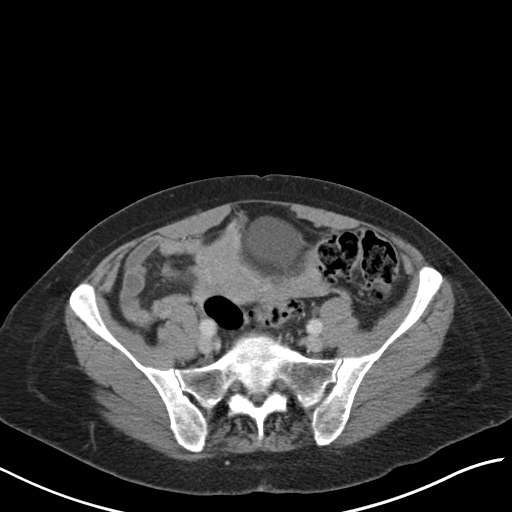
[im 36/87  soft-tissue]
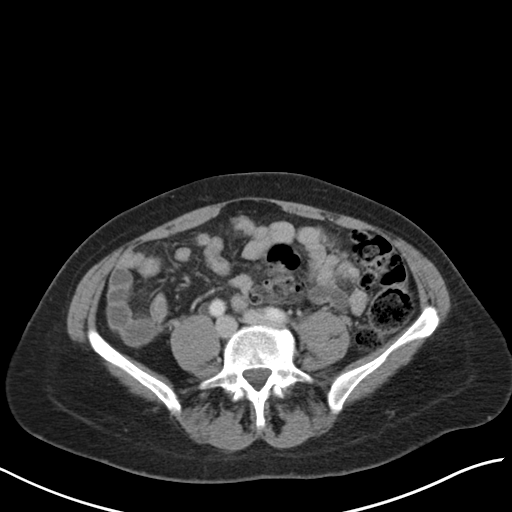
[im 44/87  soft-tissue]
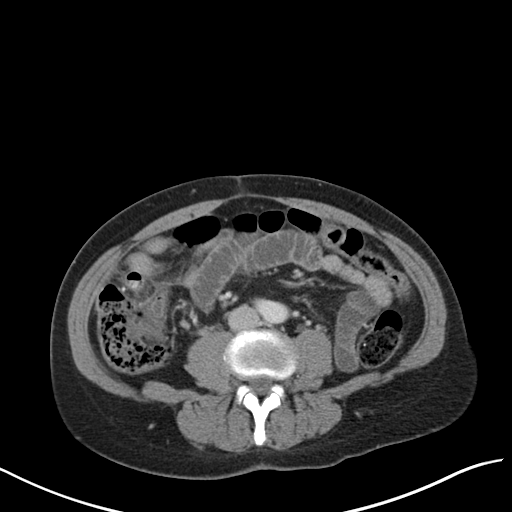
[im 51/87  soft-tissue]
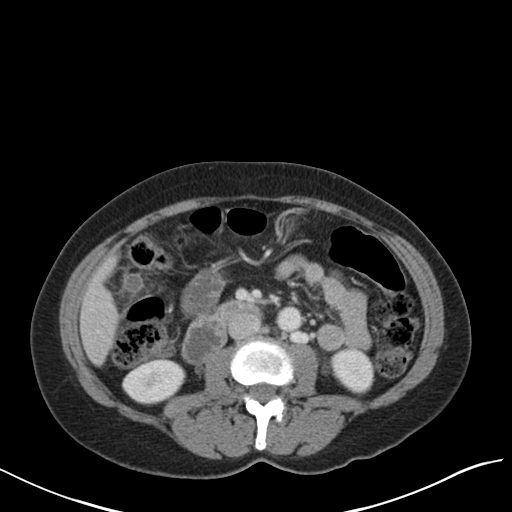
[im 58/87  soft-tissue]
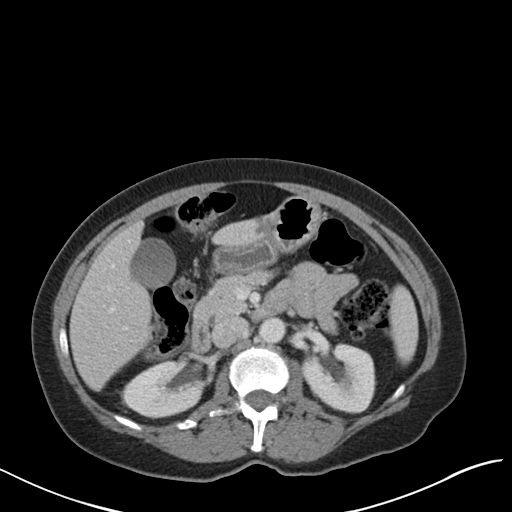
[im 65/87  soft-tissue]
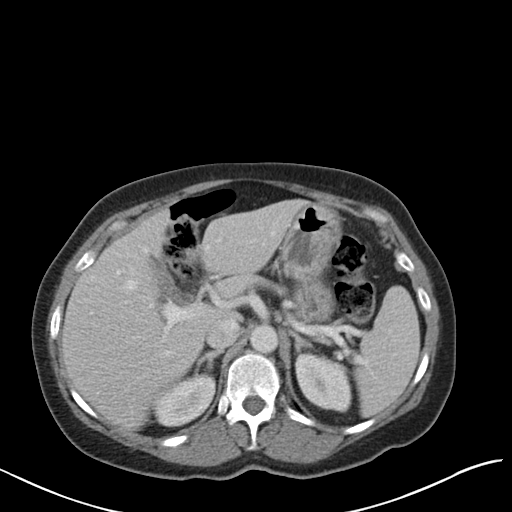
[im 65/87  bone]
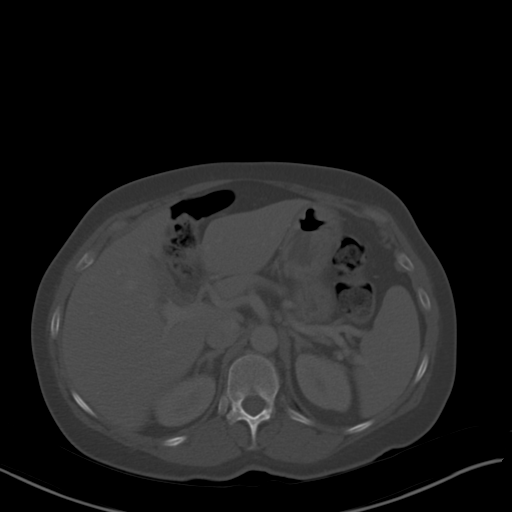
[im 72/87  soft-tissue]
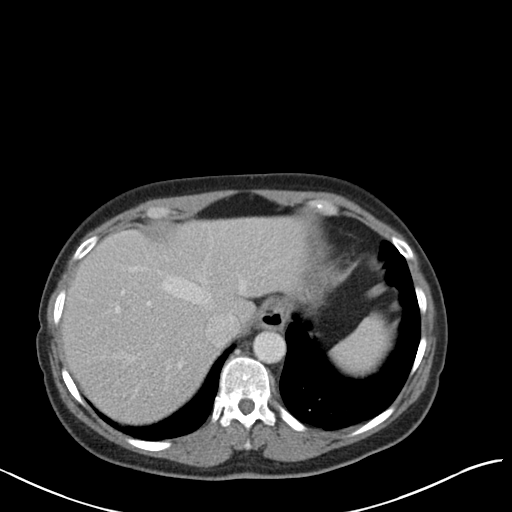
[im 79/87  soft-tissue]
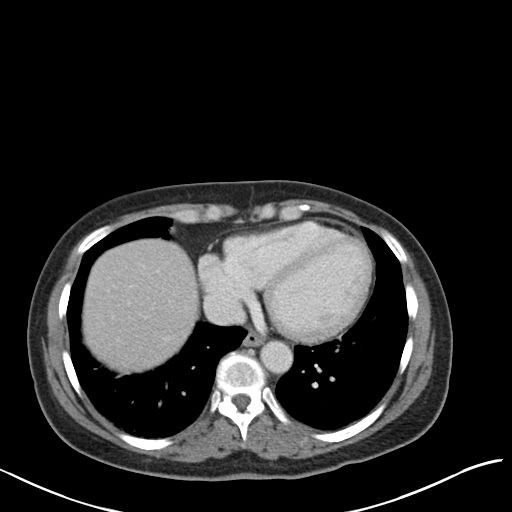

[Series 4: coronal st · coronal · 0.81mm/px · 3 of 109 slices shown]
[im 37/109  soft-tissue]
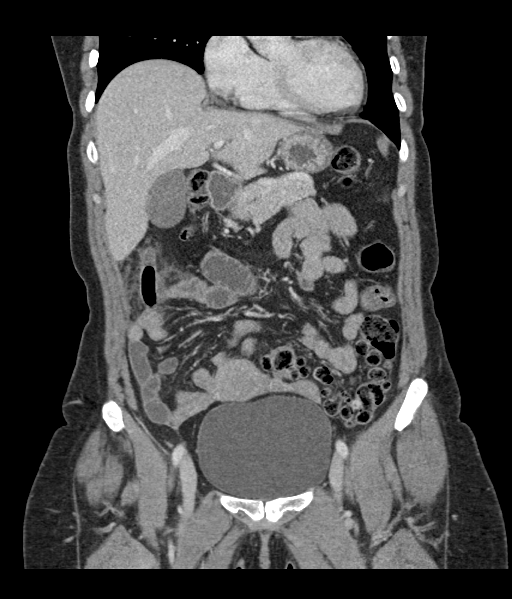
[im 49/109  soft-tissue]
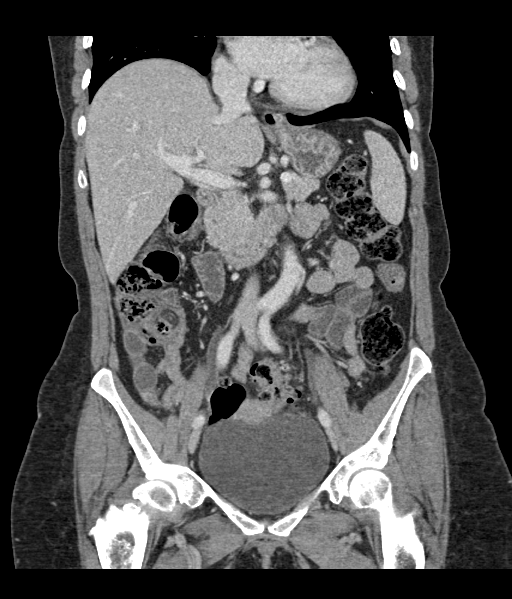
[im 61/109  soft-tissue]
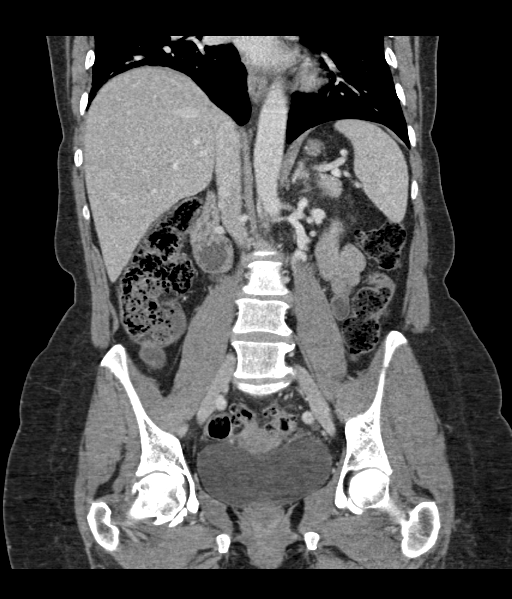

[Series 8: axial st · axial · 0.73mm/px · z∈[+136,+181]mm · 2 of 27 slices shown (2 of 2)]
[im 9/27  soft-tissue]
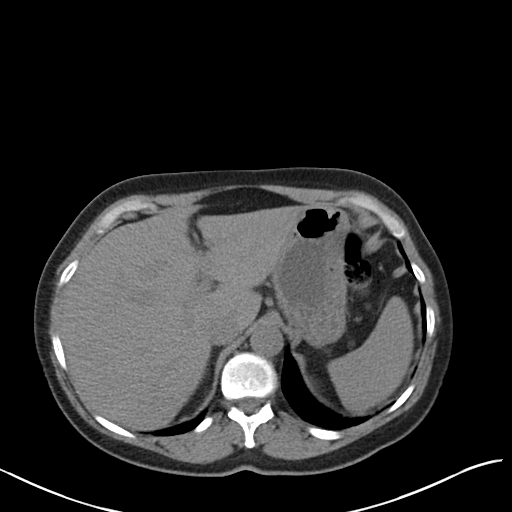
[im 18/27  soft-tissue]
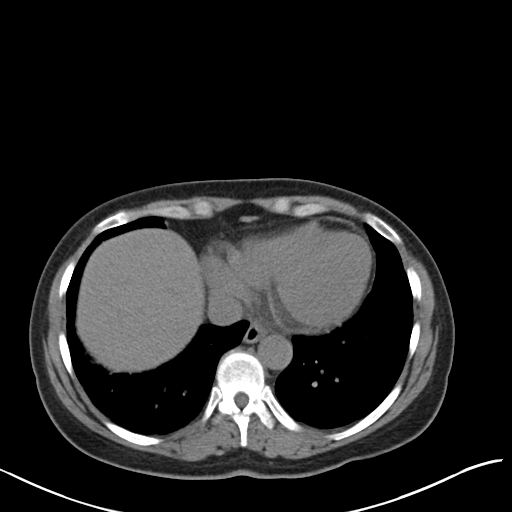

[16 of 46 positions shown; findings below may reference images not displayed]

FINDINGS: Lower chest: Unremarkable.

Hepatobiliary: Well-defined 1.8 cm low-attenuation lesion in segment
3 of the liver, compatible with a simple cyst. No other suspicious
hepatic lesions. No intra or extrahepatic biliary ductal dilatation.
Gallbladder is normal in appearance.

Pancreas: No pancreatic mass. No pancreatic ductal dilatation. No
pancreatic or peripancreatic fluid collections or inflammatory
changes.

Spleen: Unremarkable.

Adrenals/Urinary Tract: Bilateral kidneys and adrenal glands are
normal in appearance. No hydroureteronephrosis. Urinary bladder is
normal in appearance.

Stomach/Bowel: Normal appearance of the stomach. No pathologic
dilatation of small bowel or colon. Numerous colonic diverticulae
are noted, without surrounding inflammatory changes to suggest an
acute diverticulitis at this time. Evidence of acute appendicitis.

Appendix: Location: Anterior to the cecum

Diameter: 16 mm

Appendicolith: Present at the ostium

Mucosal hyper-enhancement: Present

Extraluminal gas: None.

Periappendiceal collection: Extensive periappendiceal inflammatory
changes without a well-defined abscess.

Vascular/Lymphatic: No significant atherosclerotic disease, aneurysm
or dissection noted in the abdominal or pelvic vasculature. No
lymphadenopathy noted in the abdomen or pelvis.

Reproductive: Uterus and ovaries are unremarkable in appearance.

Other: No significant volume of ascites.  No pneumoperitoneum.

Musculoskeletal: There are no aggressive appearing lytic or blastic
lesions noted in the visualized portions of the skeleton.
IMPRESSION: 1. Findings are compatible with acute appendicitis, as detailed
above. Extensive periappendiceal phlegmonous inflammation without
definite periappendiceal abscess or signs of frank perforation noted
at this time. Surgical consultation is recommended.

Critical Value/emergent results were called by telephone at the time
of interpretation on 02/03/2021 at [DATE] to provider Aniceto
Shibutani, who verbally acknowledged these results.

## 2023-02-01 HISTORY — PX: HERNIA REPAIR: SHX51

## 2023-10-08 ENCOUNTER — Encounter (HOSPITAL_COMMUNITY): Payer: Self-pay | Admitting: Emergency Medicine

## 2023-10-08 ENCOUNTER — Ambulatory Visit (HOSPITAL_BASED_OUTPATIENT_CLINIC_OR_DEPARTMENT_OTHER)
Admission: RE | Admit: 2023-10-08 | Discharge: 2023-10-08 | Disposition: A | Discharge status: Home / Self Care | Payer: 59 | Source: Ambulatory Visit

## 2023-10-08 ENCOUNTER — Encounter (HOSPITAL_BASED_OUTPATIENT_CLINIC_OR_DEPARTMENT_OTHER): Payer: Self-pay

## 2023-10-08 ENCOUNTER — Emergency Department (HOSPITAL_COMMUNITY): Payer: 59

## 2023-10-08 ENCOUNTER — Emergency Department (HOSPITAL_COMMUNITY)
Admission: EM | Admit: 2023-10-08 | Discharge: 2023-10-08 | Disposition: A | Discharge status: Home / Self Care | Payer: 59 | Attending: Emergency Medicine | Admitting: Emergency Medicine

## 2023-10-08 VITALS — BP 111/73 | HR 70 | Temp 98.3°F | Resp 20

## 2023-10-08 DIAGNOSIS — R1032 Left lower quadrant pain: Secondary | ICD-10-CM | POA: Diagnosis not present

## 2023-10-08 DIAGNOSIS — K529 Noninfective gastroenteritis and colitis, unspecified: Secondary | ICD-10-CM

## 2023-10-08 DIAGNOSIS — R1012 Left upper quadrant pain: Secondary | ICD-10-CM | POA: Insufficient documentation

## 2023-10-08 DIAGNOSIS — R1902 Left upper quadrant abdominal swelling, mass and lump: Secondary | ICD-10-CM | POA: Diagnosis not present

## 2023-10-08 LAB — CBC
HCT: 37.2 % (ref 36.0–46.0)
Hemoglobin: 12.6 g/dL (ref 12.0–15.0)
MCH: 31.1 pg (ref 26.0–34.0)
MCHC: 33.9 g/dL (ref 30.0–36.0)
MCV: 91.9 fL (ref 80.0–100.0)
Platelets: 211 10*3/uL (ref 150–400)
RBC: 4.05 MIL/uL (ref 3.87–5.11)
RDW: 12.6 % (ref 11.5–15.5)
WBC: 5.5 10*3/uL (ref 4.0–10.5)
nRBC: 0 % (ref 0.0–0.2)

## 2023-10-08 LAB — COMPREHENSIVE METABOLIC PANEL
ALT: 12 U/L (ref 0–44)
AST: 13 U/L — ABNORMAL LOW (ref 15–41)
Albumin: 4.2 g/dL (ref 3.5–5.0)
Alkaline Phosphatase: 49 U/L (ref 38–126)
Anion gap: 9 (ref 5–15)
BUN: 13 mg/dL (ref 6–20)
CO2: 26 mmol/L (ref 22–32)
Calcium: 8.8 mg/dL — ABNORMAL LOW (ref 8.9–10.3)
Chloride: 99 mmol/L (ref 98–111)
Creatinine, Ser: 0.84 mg/dL (ref 0.44–1.00)
GFR, Estimated: >60 mL/min (ref >60)
Glucose, Bld: 97 mg/dL (ref 70–99)
Potassium: 4 mmol/L (ref 3.5–5.1)
Sodium: 134 mmol/L — ABNORMAL LOW (ref 135–145)
Total Bilirubin: 0.6 mg/dL (ref 0.0–1.2)
Total Protein: 7.7 g/dL (ref 6.5–8.1)

## 2023-10-08 LAB — URINALYSIS, ROUTINE W REFLEX MICROSCOPIC
Bilirubin Urine: NEGATIVE
Glucose, UA: NEGATIVE mg/dL
Hgb urine dipstick: NEGATIVE
Ketones, ur: NEGATIVE mg/dL
Leukocytes,Ua: NEGATIVE
Nitrite: NEGATIVE
Protein, ur: NEGATIVE mg/dL
Specific Gravity, Urine: 1.005 (ref 1.005–1.030)
pH: 7 (ref 5.0–8.0)

## 2023-10-08 LAB — LIPASE, BLOOD: Lipase: 35 U/L (ref 11–51)

## 2023-10-08 MED ORDER — AMOXICILLIN-POT CLAVULANATE 875-125 MG PO TABS: 1.0000 | ORAL_TABLET | Freq: Two times a day (BID) | ORAL | 0 refills | Status: DC

## 2023-10-08 MED ORDER — AMOXICILLIN-POT CLAVULANATE 875-125 MG PO TABS
1.0000 | ORAL_TABLET | Freq: Once | ORAL | Status: AC
  Administered 2023-10-08: 1 via ORAL
  Filled 2023-10-08: qty 1

## 2023-10-08 MED ORDER — IOHEXOL 300 MG/ML  SOLN
100.0000 mL | Freq: Once | INTRAMUSCULAR | Status: AC | PRN
  Administered 2023-10-08: 100 mL via INTRAVENOUS

## 2023-10-08 NOTE — ED Provider Notes (Signed)
 [facility]    CSN: [id number] Arrival date & time: 10/08/23  1043      History   Chief Complaint Chief Complaint  Patient presents with   Abdominal Pain    Lower left side and back are hurting. - Entered by patient    HPI Joann Jenkins is a 57 y.o. female.   Joann Jenkins is a 58 y.o. female presenting for chief complaint of Abdominal Pain. Abdominal pain is to the left upper quadrant and sometimes goes through to the left flank area. Area of tenderness feels bruised. Pain started 4 days ago on Thursday October 04, 2022 and has been constant ever since. She had chills when pain started without known fever at home, this has since resolved. Pain is triggered by deep breaths, walking, standing, and movement. Pain is relieved with rest and sitting still. No recent cough, congestion, nausea, vomiting, diarrhea, blood/mucous to stools, injuries to abdomen, urinary symptoms, dizziness, acid reflux, chest pain, palpitations, shortness of breath, or rash. No recent heavy lifting or straining. She has never had a colonoscopy, cologuard has been negative. She felt pain to be related to constipation and was having small hard bowel movements with pain onset. Took laxative and has emptied bowels, though still having pain. Unsure of recent night sweats, takes hormone replacement therapy (menopause symptoms) and states she's always hot because of hot flashes. No recent weight loss without trying. 2 C sections 25-28 years ago, appendectomy in 2022, and right inguinal hernia repair in May 2024.    Abdominal Pain   Past Medical History:  Diagnosis Date   Mixed hyperlipidemia     Patient Active Problem List   Diagnosis Date Noted   S/P laparoscopic appendectomy 02/03/2021   BMI 26.0-26.9,adult 06/23/2020   Primary hypothyroidism 06/23/2020   Abnormal blood chemistry 12/18/2019   Mixed hyperlipidemia 12/18/2019    Past Surgical History:  Procedure Laterality Date    CESAREAN SECTION     X2 10-20-1994 & 07-30-1997   LAPAROSCOPIC APPENDECTOMY N/A 02/03/2021   Procedure: APPENDECTOMY LAPAROSCOPIC;  Surgeon: Teresa Lonni HERO, MD;  Location: [facility];  Service: General;  Laterality: N/A;    OB History   No obstetric history on file.      Home Medications    Prior to Admission medications   Medication Sig Start Date End Date Taking? Authorizing Provider  progesterone (PROMETRIUM) 100 MG capsule Take 100 mg by mouth daily.   Yes [provider]  acetaminophen  (TYLENOL ) 500 MG tablet Take 2 tablets (1,000 mg total) by mouth every 8 (eight) hours as needed. 02/04/21   Maczis, Michael M, PA-C  ezetimibe  (ZETIA ) 10 MG tablet Take 1 tablet (10 mg total) by mouth daily. 06/23/20   Abran Jerilynn Loving, MD  levothyroxine  (SYNTHROID ) 88 MCG tablet TAKE 1 TABLET (88 MCG TOTAL) BY MOUTH DAILY BEFORE BREAKFAST. 07/18/21   Abran Jerilynn Loving, MD  traMADol  (ULTRAM ) 50 MG tablet Take 1 tablet (50 mg total) by mouth every 6 (six) hours as needed (breakthrough pain). 02/04/21   Maczis, Michael M, PA-C  zolpidem  (AMBIEN ) 5 MG tablet Take 1 tablet (5 mg total) by mouth at bedtime as needed for sleep. 06/23/20   Abran Jerilynn Loving, MD    Family History Family History  Problem Relation Age of Onset   Pancreatic cancer Mother    COPD Father     Social History Social History   Tobacco Use   Smoking status: Never   Smokeless tobacco: Never  Vaping  Use   Vaping status: Never Used  Substance Use Topics   Alcohol use: Yes    Comment: Drinks approximately 2-3 times per week. She typically consumes wine.   Drug use: Never     Allergies   Lipitor [atorvastatin] and Rosuvastatin calcium   Review of Systems Review of Systems  Gastrointestinal:  Positive for abdominal pain.  Per HPI   Physical Exam Triage Vital Signs ED Triage Vitals  Encounter Vitals Group     BP 10/08/23 1114 111/73     Systolic BP Percentile --      Diastolic BP Percentile  --      Pulse Rate 10/08/23 1114 70     Resp 10/08/23 1114 20     Temp 10/08/23 1114 98.3 F (36.8 C)     Temp Source 10/08/23 1114 Oral     SpO2 10/08/23 1114 98 %     Weight --      Height --      Head Circumference --      Peak Flow --      Pain Score 10/08/23 1115 5     Pain Loc --      Pain Education --      Exclude from Growth Chart --    No data found.  Updated Vital Signs BP 111/73 (BP Location: Right Arm)   Pulse 70   Temp 98.3 F (36.8 C) (Oral)   Resp 20   SpO2 98%   Visual Acuity Right Eye Distance:   Left Eye Distance:   Bilateral Distance:    Right Eye Near:   Left Eye Near:    Bilateral Near:     Physical Exam Vitals and nursing note reviewed.  Constitutional:      Appearance: She is not ill-appearing or toxic-appearing.  HENT:     Head: Normocephalic and atraumatic.     Right Ear: Hearing and external ear normal.     Left Ear: Hearing and external ear normal.     Nose: Nose normal.     Mouth/Throat:     Lips: Pink.  Eyes:     General: Lids are normal. Vision grossly intact. Gaze aligned appropriately.     Extraocular Movements: Extraocular movements intact.     Conjunctiva/sclera: Conjunctivae normal.  Cardiovascular:     Rate and Rhythm: Normal rate and regular rhythm.     Heart sounds: Normal heart sounds, S1 normal and S2 normal.  Pulmonary:     Effort: Pulmonary effort is normal. No respiratory distress.     Breath sounds: Normal breath sounds and air entry.  Abdominal:     General: Abdomen is flat. Bowel sounds are normal. There is no distension.     Palpations: Abdomen is soft. There is mass. There is no shifting dullness.     Tenderness: There is abdominal tenderness in the left upper quadrant. There is left CVA tenderness. There is no right CVA tenderness, guarding or rebound.    Musculoskeletal:     Cervical back: Neck supple.  Skin:    General: Skin is warm and dry.     Capillary Refill: Capillary refill takes less than 2  seconds.     Findings: No rash.  Neurological:     General: No focal deficit present.     Mental Status: She is alert and oriented to person, place, and time. Mental status is at baseline.     Cranial Nerves: No dysarthria or facial asymmetry.  Psychiatric:  Mood and Affect: Mood normal.        Speech: Speech normal.        Behavior: Behavior normal.        Thought Content: Thought content normal.        Judgment: Judgment normal.      UC Treatments / Results  Labs (all labs ordered are listed, but only abnormal results are displayed) Labs Reviewed - No data to display  EKG   Radiology No results found.  Procedures Procedures (including critical care time)  Medications Ordered in UC Medications - No data to display  Initial Impression / Assessment and Plan / UC Course  I have reviewed the triage vital signs and the nursing notes.  Pertinent labs & imaging results that were available during my care of the patient were reviewed by me and considered in my medical decision making (see chart for details).   1. Left upper quadrant abdominal mass Unclear what is causing symptoms, though tenderness seems to be directly related to mass. Mass is very tender to palpation. CVA tenderness likely due to mass, lower suspicion for pyelonephritis, kidney stone, pneumonia.  Tenderness is localized to the abdomen, no peritoneal signs.  I'd like for her to proceed to the ER for further workup and evaluation to rule out emergent etiology of mass of unknown cause.  Discussed clinical concerns/exam findings leading to recommendation for further workup in the ER setting and risks of deferring ER visit with patient/family. Patient/family express understanding and agreement with plan, discharged to ER via personal vehicle.  Final Clinical Impressions(s) / UC Diagnoses   Final diagnoses:  Left upper quadrant abdominal mass   Discharge Instructions   None    ED Prescriptions    None    PDMP not reviewed this encounter.   Enedelia Dorna HERO, [place] 10/08/23 1203

## 2023-10-08 NOTE — ED Provider Notes (Signed)
 [place] EMERGENCY DEPARTMENT AT [facility] Provider Note   CSN: [id number] Arrival date & time: 10/08/23  1242     History  Chief Complaint  Patient presents with   Abdominal Pain    Joann Jenkins is a 58 y.o. female.  HPI   58 year old female presents emergency department with left-sided abdominal pain.  Patient was sent from urgent care with concern for palpable abdominal mass for further emergent evaluation.  Patient is status post appendectomy.  Sounds like she had associated phlegmon that had to be drained at that time.  She is also had hernia surgery.  About 4 days ago she developed left-sided discomfort.  She took stool softeners to rule out constipation being the source.  But then she started having a palpable tenderness just under her left rib cage so she went to urgent care for evaluation.  She denies any fever, genitourinary symptoms, diarrhea or constipation.  Denies any bloody bowel movements.  Home Medications Prior to Admission medications   Medication Sig Start Date End Date Taking? Authorizing Provider  acetaminophen  (TYLENOL ) 500 MG tablet Take 2 tablets (1,000 mg total) by mouth every 8 (eight) hours as needed. Patient taking differently: Take 500 mg by mouth every 8 (eight) hours as needed for mild pain (pain score 1-3) or moderate pain (pain score 4-6). 02/04/21  Yes Maczis, Michael M, PA-C  KRILL OIL PO Take 1 capsule by mouth daily.   Yes [provider]  levothyroxine  (SYNTHROID ) 88 MCG tablet TAKE 1 TABLET (88 MCG TOTAL) BY MOUTH DAILY BEFORE BREAKFAST. 07/18/21  Yes Abran Jerilynn Loving, MD  liothyronine (CYTOMEL) 25 MCG tablet Take 12.5 mcg by mouth every morning. 09/28/23  Yes [provider]  Multiple Vitamins-Minerals (MULTIVITAMIN WITH MINERALS) tablet Take 1 tablet by mouth daily.   Yes [provider]  Multiple Vitamins-Minerals (ZINC PO) Take 1 tablet by mouth daily.   Yes [provider]  OVER  THE COUNTER MEDICATION Take 4 capsules by mouth daily. Nutrafol   Yes [provider]  PRESCRIPTION MEDICATION Inject 1 Dose into the skin every 3 (three) months. Testosterone Pellet   Yes [provider]  progesterone (PROMETRIUM) 100 MG capsule Take 100 mg by mouth at bedtime.   Yes [provider]      Allergies    Lipitor [atorvastatin] and Rosuvastatin calcium    Review of Systems   Review of Systems  Constitutional:  Negative for fever.  Respiratory:  Negative for shortness of breath.   Cardiovascular:  Negative for chest pain.  Gastrointestinal:  Positive for abdominal pain. Negative for abdominal distention, anal bleeding, blood in stool, constipation, diarrhea, nausea and vomiting.  Genitourinary:  Positive for flank pain. Negative for difficulty urinating, dysuria and hematuria.  Skin:  Negative for rash.  Neurological:  Negative for headaches.    Physical Exam Updated Vital Signs BP 124/79 (BP Location: Left Arm)   Pulse 76   Temp 98.5 F (36.9 C) (Oral)   Resp 16   Ht 5' 2 (1.575 m)   Wt 66 kg   SpO2 100%   BMI 26.61 kg/m  Physical Exam Vitals and nursing note reviewed.  Constitutional:      General: She is not in acute distress.    Appearance: Normal appearance.  HENT:     Head: Normocephalic.     Mouth/Throat:     Mouth: Mucous membranes are moist.  Cardiovascular:     Rate and Rhythm: Normal rate.  Pulmonary:  Effort: Pulmonary effort is normal. No respiratory distress.  Abdominal:     General: Bowel sounds are normal.     Palpations: Abdomen is soft.     Tenderness: There is abdominal tenderness in the left upper quadrant and left lower quadrant. There is no guarding.  Skin:    General: Skin is warm.  Neurological:     Mental Status: She is alert and oriented to person, place, and time. Mental status is at baseline.  Psychiatric:        Mood and Affect: Mood normal.     ED Results / Procedures / Treatments    Labs (all labs ordered are listed, but only abnormal results are displayed) Labs Reviewed  COMPREHENSIVE METABOLIC PANEL - Abnormal; Notable for the following components:      Result Value   Sodium 134 (*)    Calcium 8.8 (*)    AST 13 (*)    All other components within normal limits  LIPASE, BLOOD  CBC  URINALYSIS, ROUTINE W REFLEX MICROSCOPIC    EKG None  Radiology No results found.  Procedures Procedures    Medications Ordered in ED Medications - No data to display  ED Course/ Medical Decision Making/ A&P                                 Medical Decision Making Amount and/or Complexity of Data Reviewed Labs: ordered. Radiology: ordered.  Risk Prescription drug management.   58 year old female presents emergency department left-sided abdominal pain.  Sent from urgent care for concern of palpable mass/swelling in the left side of the abdomen.  Vital signs are normal and stable.  She does have tenderness to the left side of the abdomen/flank.  I do not appreciate a focal mass on exam.  Blood work is reassuring however given the tenderness on exam CT of the abdomen pelvis was pursued.  This identifies focal colitis at the splenic flexure with surrounding inflammation.  I will treat with antibiotics but then it is strongly recommended that she gets reevaluated after this resolution with imaging to ensure that this inflammation is not obscuring anything like a possible mass.  This was discussed with the patient and placed on her discharge papers as a recommendation.  Antibiotics have been sent and she will follow-up as an outpatient.  Patient at this time appears safe and stable for discharge and close outpatient follow up. Discharge plan and strict return to ED precautions discussed, patient verbalizes understanding and agreement.        Final Clinical Impression(s) / ED Diagnoses Final diagnoses:  None    Rx / DC Orders ED Discharge Orders     None          [name], DO 10/08/23 2200

## 2023-10-08 NOTE — ED Triage Notes (Signed)
 Appendectomy 2022. Hernia repair May 2024. States had a pocket of fluid in abdomen during appy and it was drained in 2022. Patient now having pain to left side of abdomen with radiation to left side and left side of back. Originally thought maybe constipation but took a laxative and has had 3 satisfactory bowel movement. No urinary symptoms. States starting to feel discomfort to left side when taking deep breath.

## 2023-10-08 NOTE — ED Triage Notes (Signed)
 Pt sent from UC due to a mass they found on left side. Pain started to left side of abd and flank area. Denies urinary symptoms. Pain worse with movement. Denies n/v/d.

## 2023-10-08 NOTE — Discharge Instructions (Signed)
 You have been seen and discharged from the emergency department.  Your workup is consistent with colitis.  You need to take antibiotics.  Once this is treated, you need to be reevaluated with repeat imaging or colonoscopy to ensure that the inflammation seen on the CAT scan today is not obscuring any other findings.  You may take Tylenol /ibuprofen  as needed for pain control.  Follow-up with your primary provider for further evaluation and further care. Take home medications as prescribed. If you have any worsening symptoms or further concerns for your health please return to an emergency department for further evaluation.

## 2024-05-27 ENCOUNTER — Other Ambulatory Visit: Payer: Self-pay | Admitting: Physician Assistant

## 2024-05-27 DIAGNOSIS — M5412 Radiculopathy, cervical region: Secondary | ICD-10-CM

## 2024-06-06 ENCOUNTER — Encounter: Payer: Self-pay | Admitting: Physician Assistant

## 2024-06-06 NOTE — Discharge Instructions (Signed)

## 2024-06-07 ENCOUNTER — Ambulatory Visit
Admission: RE | Admit: 2024-06-07 | Discharge: 2024-06-07 | Disposition: A | Discharge status: Home / Self Care | Source: Ambulatory Visit | Attending: Physician Assistant | Admitting: Physician Assistant

## 2024-06-07 DIAGNOSIS — M5412 Radiculopathy, cervical region: Secondary | ICD-10-CM

## 2024-06-07 MED ORDER — IOPAMIDOL (ISOVUE-M 300) INJECTION 61%
1.0000 mL | Freq: Once | INTRAMUSCULAR | Status: AC
  Administered 2024-06-07: 1 mL via EPIDURAL

## 2024-06-07 MED ORDER — TRIAMCINOLONE ACETONIDE 40 MG/ML IJ SUSP (RADIOLOGY)
60.0000 mg | Freq: Once | INTRAMUSCULAR | Status: AC
  Administered 2024-06-07: 60 mg via EPIDURAL

## 2024-06-26 ENCOUNTER — Other Ambulatory Visit (HOSPITAL_BASED_OUTPATIENT_CLINIC_OR_DEPARTMENT_OTHER): Payer: Self-pay

## 2024-06-26 ENCOUNTER — Encounter (HOSPITAL_BASED_OUTPATIENT_CLINIC_OR_DEPARTMENT_OTHER): Payer: Self-pay

## 2024-06-26 ENCOUNTER — Ambulatory Visit (HOSPITAL_BASED_OUTPATIENT_CLINIC_OR_DEPARTMENT_OTHER)
Admission: RE | Admit: 2024-06-26 | Discharge: 2024-06-26 | Disposition: A | Discharge status: Home / Self Care | Source: Ambulatory Visit | Attending: Family Medicine | Admitting: Family Medicine

## 2024-06-26 ENCOUNTER — Ambulatory Visit (INDEPENDENT_AMBULATORY_CARE_PROVIDER_SITE_OTHER): Admitting: Radiology

## 2024-06-26 VITALS — BP 119/78 | HR 75 | Temp 98.4°F | Resp 20

## 2024-06-26 DIAGNOSIS — N760 Acute vaginitis: Secondary | ICD-10-CM | POA: Diagnosis not present

## 2024-06-26 DIAGNOSIS — N950 Postmenopausal bleeding: Secondary | ICD-10-CM

## 2024-06-26 DIAGNOSIS — R310 Gross hematuria: Secondary | ICD-10-CM | POA: Diagnosis not present

## 2024-06-26 LAB — POCT URINE DIPSTICK
Bilirubin, UA: NEGATIVE
Glucose, UA: NEGATIVE mg/dL
Ketones, POC UA: NEGATIVE mg/dL
Leukocytes, UA: NEGATIVE
Nitrite, UA: NEGATIVE
Protein Ur, POC: NEGATIVE mg/dL
Spec Grav, UA: 1.01 (ref 1.010–1.025)
Urobilinogen, UA: 0.2 U/dL
pH, UA: 6.5 (ref 5.0–8.0)

## 2024-06-26 MED ORDER — METRONIDAZOLE 500 MG PO TABS
500.0000 mg | ORAL_TABLET | Freq: Two times a day (BID) | ORAL | 0 refills | Status: AC
  Filled 2024-06-26: qty 14, 7d supply, fill #0

## 2024-06-26 NOTE — Discharge Instructions (Addendum)
 Postmenopausal vaginal bleeding, vaginitis and hematuria: Urinalysis shows gross hematuria but it appears that it is vaginal not urinary tract in nature.  Pelvic exam showed significant cervical and uterine bleeding.  Transvaginal ultrasound of the pelvis is still pending.  Will update the patient once the results are available.    Exam also showed some vaginitis.  Swab collected for bacterial vaginosis and vaginal yeast infection.  Will treat for bacterial vaginosis pending results of the swab.  Metronidazole , 500 mg, 1 pill twice daily for 7 days.  Patient needs to see gynecology for further workup of postmenopausal vaginal bleeding.

## 2024-06-26 NOTE — ED Provider Notes (Signed)
 [facility]    CSN: [id number] Arrival date & time: 06/26/24  1414      History   Chief Complaint Chief Complaint  Patient presents with   Hematuria    HPI Joann Jenkins is a 58 y.o. female.   58 year old female with report of blood in her urine noted on the evening of 06/25/2024.  She has also passed clots when urinating this morning.  Her urine is bright red.  She denies pelvic pain, back pain, urinary frequency, urinary urgency, dysuria.  No history of kidney stones.  She had abdominal pain and problems in January 2025 and was seen at [facility] and had a CT scan of the abdomen and pelvis with IV contrast on 10/08/2023.  The basic findings were diverticulosis, hepatic cyst and possible colitis.  At that time the uterus and the ovaries were essentially normal or unremarkable.  Has been menopausal for the last 10 years.   Hematuria Pertinent negatives include no chest pain, no abdominal pain and no shortness of breath.    Past Medical History:  Diagnosis Date   Mixed hyperlipidemia     Patient Active Problem List   Diagnosis Date Noted   S/P laparoscopic appendectomy 02/03/2021   BMI 26.0-26.9,adult 06/23/2020   Primary hypothyroidism 06/23/2020   Abnormal blood chemistry 12/18/2019   Mixed hyperlipidemia 12/18/2019    Past Surgical History:  Procedure Laterality Date   CESAREAN SECTION     X2 10-20-1994 & 07-30-1997   HERNIA REPAIR  02/2023   LAPAROSCOPIC APPENDECTOMY N/A 02/03/2021   Procedure: APPENDECTOMY LAPAROSCOPIC;  Surgeon: Teresa Lonni HERO, MD;  Location: [facility];  Service: General;  Laterality: N/A;    OB History   No obstetric history on file.      Home Medications    Prior to Admission medications   Medication Sig Start Date End Date Taking? Authorizing Provider  metroNIDAZOLE  (FLAGYL ) 500 MG tablet Take 1 tablet (500 mg total) by mouth 2 (two) times daily. 06/26/24  Yes Ival Domino, FNP  acetaminophen  (TYLENOL ) 500 MG  tablet Take 2 tablets (1,000 mg total) by mouth every 8 (eight) hours as needed. Patient taking differently: Take 500 mg by mouth every 8 (eight) hours as needed for mild pain (pain score 1-3) or moderate pain (pain score 4-6). 02/04/21   Maczis, Michael M, PA-C  KRILL OIL PO Take 1 capsule by mouth daily.    [provider]  levothyroxine  (SYNTHROID ) 88 MCG tablet TAKE 1 TABLET (88 MCG TOTAL) BY MOUTH DAILY BEFORE BREAKFAST. 07/18/21   Abran Jerilynn Loving, MD  liothyronine (CYTOMEL) 25 MCG tablet Take 12.5 mcg by mouth every morning. 09/28/23   [provider]  Multiple Vitamins-Minerals (MULTIVITAMIN WITH MINERALS) tablet Take 1 tablet by mouth daily.    [provider]  PRESCRIPTION MEDICATION Inject 1 Dose into the skin every 3 (three) months. Testosterone Pellet    [provider]  progesterone (PROMETRIUM) 100 MG capsule Take 100 mg by mouth at bedtime.    [provider]    Family History Family History  Problem Relation Age of Onset   Pancreatic cancer Mother    COPD Father     Social History Social History   Tobacco Use   Smoking status: Never   Smokeless tobacco: Never  Vaping Use   Vaping status: Never Used  Substance Use Topics   Alcohol use: Yes    Comment: occ   Drug use: Never     Allergies  Lipitor [atorvastatin] and Rosuvastatin calcium   Review of Systems Review of Systems  Constitutional:  Negative for chills and fever.  HENT:  Negative for ear pain and sore throat.   Eyes:  Negative for pain and visual disturbance.  Respiratory:  Negative for cough and shortness of breath.   Cardiovascular:  Negative for chest pain and palpitations.  Gastrointestinal:  Negative for abdominal pain, constipation, diarrhea, nausea and vomiting.  Genitourinary:  Positive for hematuria and vaginal bleeding. Negative for dysuria.  Musculoskeletal:  Negative for arthralgias and back pain.  Skin:  Negative for color change and  rash.  Neurological:  Negative for seizures and syncope.  All other systems reviewed and are negative.    Physical Exam Triage Vital Signs ED Triage Vitals  Encounter Vitals Group     BP 06/26/24 1443 119/78     Girls Systolic BP Percentile --      Girls Diastolic BP Percentile --      Boys Systolic BP Percentile --      Boys Diastolic BP Percentile --      Pulse Rate 06/26/24 1443 75     Resp 06/26/24 1443 20     Temp 06/26/24 1443 98.4 F (36.9 C)     Temp Source 06/26/24 1443 Oral     SpO2 06/26/24 1443 98 %     Weight --      Height --      Head Circumference --      Peak Flow --      Pain Score 06/26/24 1440 0     Pain Loc --      Pain Education --      Exclude from Growth Chart --    No data found.  Updated Vital Signs BP 119/78 (BP Location: Right Arm)   Pulse 75   Temp 98.4 F (36.9 C) (Oral)   Resp 20   SpO2 98%   Visual Acuity Right Eye Distance:   Left Eye Distance:   Bilateral Distance:    Right Eye Near:   Left Eye Near:    Bilateral Near:     Physical Exam Vitals and nursing note reviewed. Exam conducted with a chaperone present Oscar Flora, [employer] fundraiser).  Constitutional:      General: She is not in acute distress.    Appearance: She is well-developed. She is not ill-appearing or toxic-appearing.  HENT:     Head: Normocephalic and atraumatic.     Right Ear: Hearing, tympanic membrane, ear canal and external ear normal.     Left Ear: Hearing, tympanic membrane, ear canal and external ear normal.     Nose: No congestion or rhinorrhea.     Right Sinus: No maxillary sinus tenderness or frontal sinus tenderness.     Left Sinus: No maxillary sinus tenderness or frontal sinus tenderness.     Mouth/Throat:     Lips: Pink.     Mouth: Mucous membranes are moist.     Pharynx: Uvula midline. No oropharyngeal exudate or posterior oropharyngeal erythema.     Tonsils: No tonsillar exudate.  Eyes:     Conjunctiva/sclera: Conjunctivae normal.     Pupils:  Pupils are equal, round, and reactive to light.  Cardiovascular:     Rate and Rhythm: Normal rate and regular rhythm.     Heart sounds: S1 normal and S2 normal. No murmur heard. Pulmonary:     Effort: Pulmonary effort is normal. No respiratory distress.     Breath sounds: Normal breath sounds.  No decreased breath sounds, wheezing, rhonchi or rales.  Abdominal:     General: Bowel sounds are normal.     Palpations: Abdomen is soft.     Tenderness: There is no abdominal tenderness.     Hernia: There is no hernia in the left inguinal area or right inguinal area.  Genitourinary:    Exam position: Lithotomy position.     Pubic Area: No rash or pubic lice.      Labia:        Right: No rash, tenderness, lesion or injury.        Left: No rash, tenderness, lesion or injury.      Urethra: No prolapse, urethral pain, urethral swelling or urethral lesion.     Vagina: No signs of injury and foreign body. Vaginal discharge (Unable to clearly differentiate discharge but the vaginal bleeding has a very strong, fishy, foul odor) and bleeding present. No erythema, tenderness, lesions or prolapsed vaginal walls.     Cervix: Cervical bleeding present. No cervical motion tenderness, discharge, friability, lesion, erythema or eversion.     Uterus: Not deviated, not enlarged, not fixed, not tender and no uterine prolapse.      Adnexa:        Right: No mass, tenderness or fullness.         Left: No mass, tenderness or fullness.       Comments: Cervix is atrophied and very soft. Musculoskeletal:        General: No swelling.     Cervical back: Neck supple.  Lymphadenopathy:     Head:     Right side of head: No submental, submandibular, tonsillar, preauricular or posterior auricular adenopathy.     Left side of head: No submental, submandibular, tonsillar, preauricular or posterior auricular adenopathy.     Cervical: No cervical adenopathy.     Right cervical: No superficial cervical adenopathy.    Left  cervical: No superficial cervical adenopathy.     Lower Body: No right inguinal adenopathy. No left inguinal adenopathy.  Skin:    General: Skin is warm and dry.     Capillary Refill: Capillary refill takes less than 2 seconds.     Findings: No rash.  Neurological:     Mental Status: She is alert and oriented to person, place, and time.  Psychiatric:        Mood and Affect: Mood normal.      UC Treatments / Results  Labs (all labs ordered are listed, but only abnormal results are displayed) Labs Reviewed  POCT URINE DIPSTICK - Abnormal; Notable for the following components:      Result Value   Blood, UA large (*)    All other components within normal limits  CERVICOVAGINAL ANCILLARY ONLY    EKG   Radiology No results found.  EXAM:  10/08/2023: CT ABDOMEN AND PELVIS WITH CONTRAST TECHNIQUE:  Multidetector CT imaging of the abdomen and pelvis was performed using the standard protocol following bolus administration of intravenous contrast. RADIATION DOSE REDUCTION: This exam was performed according to the departmental dose-optimization program which includes automated exposure control, adjustment of the mA and/or kV according to patient size and/or use of iterative reconstruction technique. CONTRAST: OMNIPAQUE  IOHEXOL  300 MG/ML SOLN COMPARISON: None Available. FINDINGS:    Lower chest: Dependent subsegmental atelectasis or scarring at the bases. No pericardial or pleural effusion. Hepatobiliary: 2 cm cyst in the left lobe. No additional hepatic parenchymal lesions identified. No gallstones, gallbladder wall thickening, or biliary dilatation. Pancreas:  Unremarkable. No pancreatic ductal dilatation or surrounding inflammatory changes. Spleen: Normal in size without focal abnormality. Adrenals/Urinary Tract: Adrenal glands are unremarkable. Kidneys are normal, without renal calculi, focal lesion, or hydronephrosis.  Bladder is unremarkable. Stomach/Bowel: No bowel dilatation  to suggest obstruction increased stool consistent with constipation. Scattered colonic diverticula. Appendix has been removed. There is mucosal abnormality with thickening of the proximal descending colon at the splenic flexure with adjacent colonic fat stranding. These findings could represent colitis. Underlying mucosal mass not excluded, and endoscopic correlation may be helpful. Vascular/Lymphatic: No significant vascular findings are present. No enlarged abdominal or pelvic lymph nodes. Reproductive: Uterus and bilateral adnexa are unremarkable. Other: No abdominal wall hernia or abnormality. No abdominopelvic ascites. Musculoskeletal: No acute or significant osseous findings. IMPRESSION: 1. Mucosal abnormality with thickening and adjacent fat stranding involving the proximal descending colon. Findings could represent colitis. Underlying mucosal mass not excluded. Endoscopic correlation may be helpful. 2. Constipation. 3. Diverticulosis. 4. Hepatic cyst. Electronically Signed   By: Fonda Field M.D.   On: 10/08/2023 16:38   Procedures Procedures (including critical care time)  Medications Ordered in UC Medications - No data to display  Initial Impression / Assessment and Plan / UC Course  I have reviewed the triage vital signs and the nursing notes.  Pertinent labs & imaging results that were available during my care of the patient were reviewed by me and considered in my medical decision making (see chart for details).  Plan of Care: Postmenopausal bleeding, vaginitis and hematuria: Urinalysis showed gross hematuria but vaginal exam showed significant cervical and uterine bleeding.  Transvaginal ultrasound of the pelvis is still pending.  Will update the patient once the ultrasound results.  There was also signs of vaginitis and swab was collected to check for bacterial vaginosis and yeast infection.  Will treat for bacterial vaginosis with metronidazole , 500 mg, 1 pill twice daily  for 7 days.  Will adjust the plan of care once the STI swab results  Patient will see primary care to get an expedited referral to gynecology.  Follow-up here if needed.  I reviewed the plan of care with the patient and/or the patient's guardian.  The patient and/or guardian had time to ask questions and acknowledged that the questions were answered.  I provided instruction on symptoms or reasons to return here or to go to an ER, if symptoms/condition did not improve, worsened or if new symptoms occurred.  Final Clinical Impressions(s) / UC Diagnoses   Final diagnoses:  Gross hematuria  Postmenopausal vaginal bleeding  Vaginitis and vulvovaginitis     Discharge Instructions      Postmenopausal vaginal bleeding, vaginitis and hematuria: Urinalysis shows gross hematuria but it appears that it is vaginal not urinary tract in nature.  Pelvic exam showed significant cervical and uterine bleeding.  Transvaginal ultrasound of the pelvis is still pending.  Will update the patient once the results are available.    Exam also showed some vaginitis.  Swab collected for bacterial vaginosis and vaginal yeast infection.  Will treat for bacterial vaginosis pending results of the swab.  Metronidazole , 500 mg, 1 pill twice daily for 7 days.  Patient needs to see gynecology for further workup of postmenopausal vaginal bleeding.     ED Prescriptions     Medication Sig Dispense Auth. Provider   metroNIDAZOLE  (FLAGYL ) 500 MG tablet Take 1 tablet (500 mg total) by mouth 2 (two) times daily. 14 tablet Gaberial Cada, FNP      PDMP not reviewed this  encounter.   Ival Domino, FNP 06/26/24 1721

## 2024-06-26 NOTE — ED Triage Notes (Signed)
 Pt states that last night she had blood in her urine after she wiped. This morning she has passed 2 blood clots and had red urine. Pt denies pelvic or back pain, urinary frequency, dysuria, or hx of kidney stones.

## 2024-06-27 ENCOUNTER — Ambulatory Visit (HOSPITAL_COMMUNITY): Payer: Self-pay

## 2024-06-27 LAB — CERVICOVAGINAL ANCILLARY ONLY
Bacterial Vaginitis (gardnerella): NEGATIVE
Candida Glabrata: NEGATIVE
Candida Vaginitis: NEGATIVE
Comment: NEGATIVE
Comment: NEGATIVE
Comment: NEGATIVE

## 2024-06-27 NOTE — Progress Notes (Signed)
 STI swab was negative for bacterial vaginosis and yeast.  The swab had quite a bit of blood and might not have been a great collection.  The patient had a very strong foul fishy odor with the vaginal exam.  She is tolerating the metronidazole  well and will finish it in case there is some level of bacterial vaginosis not picked up on the swab.  I was able to speak to her directly and answer her questions.

## 2024-06-27 NOTE — Progress Notes (Signed)
 Ultrasound shows a nabothian cyst there is small and some minimal endometrial thickening.  Patient has an appointment with a Dr. Tobie, OB/GYN in [facility] on 07/08/2024 for further workup of postmenopausal bleeding.  I was able to update the patient on her results and discuss her results.

## 2024-08-20 ENCOUNTER — Other Ambulatory Visit: Payer: Self-pay | Admitting: Physician Assistant

## 2024-08-20 ENCOUNTER — Encounter: Payer: Self-pay | Admitting: Physician Assistant

## 2024-08-20 DIAGNOSIS — M5412 Radiculopathy, cervical region: Secondary | ICD-10-CM

## 2024-09-09 NOTE — Discharge Instructions (Signed)

## 2024-09-10 ENCOUNTER — Inpatient Hospital Stay
Admission: RE | Admit: 2024-09-10 | Discharge: 2024-09-10 | Disposition: A | Source: Ambulatory Visit | Attending: Physician Assistant | Admitting: Physician Assistant

## 2024-09-10 DIAGNOSIS — M5412 Radiculopathy, cervical region: Secondary | ICD-10-CM

## 2024-09-10 MED ORDER — IOPAMIDOL (ISOVUE-M 300) INJECTION 61%
1.0000 mL | Freq: Once | INTRAMUSCULAR | Status: AC
  Administered 2024-09-10: 1 mL via EPIDURAL

## 2024-09-10 MED ORDER — TRIAMCINOLONE ACETONIDE 40 MG/ML IJ SUSP (RADIOLOGY)
60.0000 mg | Freq: Once | INTRAMUSCULAR | Status: AC
  Administered 2024-09-10: 60 mg via EPIDURAL
